# Patient Record
Sex: Female | Born: 1935 | Race: White | Hispanic: No | Marital: Married | Smoking: Never smoker
Health system: Southern US, Community
[De-identification: ages and names within clinical notes are randomized; demographics above are authoritative.]

## PROBLEM LIST (undated history)

## (undated) DIAGNOSIS — I10 Essential (primary) hypertension: Secondary | ICD-10-CM

## (undated) HISTORY — DX: Essential (primary) hypertension: I10

## (undated) HISTORY — PX: EYE SURGERY: SHX253

---

## 1989-11-11 HISTORY — PX: VAGINAL HYSTERECTOMY: SUR661

## 1998-08-18 ENCOUNTER — Other Ambulatory Visit: Admission: RE | Admit: 1998-08-18 | Discharge: 1998-08-18 | Payer: Self-pay | Admitting: Obstetrics and Gynecology

## 1999-09-04 ENCOUNTER — Other Ambulatory Visit: Admission: RE | Admit: 1999-09-04 | Discharge: 1999-09-04 | Payer: Self-pay | Admitting: Obstetrics and Gynecology

## 2001-10-19 ENCOUNTER — Other Ambulatory Visit: Admission: RE | Admit: 2001-10-19 | Discharge: 2001-10-19 | Payer: Self-pay | Admitting: Obstetrics and Gynecology

## 2003-12-05 ENCOUNTER — Other Ambulatory Visit: Admission: RE | Admit: 2003-12-05 | Discharge: 2003-12-05 | Payer: Self-pay | Admitting: Obstetrics and Gynecology

## 2009-05-26 ENCOUNTER — Emergency Department (HOSPITAL_COMMUNITY): Admission: EM | Admit: 2009-05-26 | Discharge: 2009-05-27 | Payer: Self-pay | Admitting: Emergency Medicine

## 2011-02-17 LAB — URINALYSIS, ROUTINE W REFLEX MICROSCOPIC
Bilirubin Urine: NEGATIVE
Ketones, ur: NEGATIVE mg/dL
Nitrite: NEGATIVE
Specific Gravity, Urine: 1.007 (ref 1.005–1.030)
Urobilinogen, UA: 0.2 mg/dL (ref 0.0–1.0)
pH: 7.5 (ref 5.0–8.0)

## 2011-02-17 LAB — DIFFERENTIAL
Basophils Absolute: 0.1 10*3/uL (ref 0.0–0.1)
Basophils Relative: 0 % (ref 0–1)
Eosinophils Relative: 0 % (ref 0–5)
Lymphocytes Relative: 8 % — ABNORMAL LOW (ref 12–46)
Monocytes Absolute: 0.6 10*3/uL (ref 0.1–1.0)
Neutro Abs: 11.8 10*3/uL — ABNORMAL HIGH (ref 1.7–7.7)

## 2011-02-17 LAB — HEPATIC FUNCTION PANEL
Bilirubin, Direct: 0.2 mg/dL (ref 0.0–0.3)
Indirect Bilirubin: 0.9 mg/dL (ref 0.3–0.9)
Total Protein: 6.9 g/dL (ref 6.0–8.3)

## 2011-02-17 LAB — POCT I-STAT, CHEM 8
Calcium, Ion: 1.03 mmol/L — ABNORMAL LOW (ref 1.12–1.32)
Chloride: 93 mEq/L — ABNORMAL LOW (ref 96–112)
Glucose, Bld: 123 mg/dL — ABNORMAL HIGH (ref 70–99)
HCT: 39 % (ref 36.0–46.0)
Hemoglobin: 13.3 g/dL (ref 12.0–15.0)

## 2011-02-17 LAB — LIPASE, BLOOD: Lipase: 23 U/L (ref 11–59)

## 2011-02-17 LAB — CBC
MCHC: 33.2 g/dL (ref 30.0–36.0)
Platelets: 239 10*3/uL (ref 150–400)
RDW: 12.8 % (ref 11.5–15.5)

## 2012-02-06 ENCOUNTER — Encounter: Payer: Self-pay | Admitting: Gastroenterology

## 2012-02-26 ENCOUNTER — Ambulatory Visit (AMBULATORY_SURGERY_CENTER): Payer: Medicare Other | Admitting: *Deleted

## 2012-02-26 VITALS — Ht 62.0 in | Wt 133.4 lb

## 2012-02-26 DIAGNOSIS — Z1211 Encounter for screening for malignant neoplasm of colon: Secondary | ICD-10-CM

## 2012-02-26 MED ORDER — MOVIPREP 100 G PO SOLR: ORAL | Status: DC

## 2012-03-18 ENCOUNTER — Ambulatory Visit (AMBULATORY_SURGERY_CENTER): Payer: Medicare Other | Admitting: Gastroenterology

## 2012-03-18 ENCOUNTER — Encounter: Payer: Self-pay | Admitting: Gastroenterology

## 2012-03-18 VITALS — BP 140/91 | HR 74 | Temp 97.2°F | Resp 17 | Ht 62.0 in | Wt 133.0 lb

## 2012-03-18 DIAGNOSIS — Z1211 Encounter for screening for malignant neoplasm of colon: Secondary | ICD-10-CM

## 2012-03-18 DIAGNOSIS — K5730 Diverticulosis of large intestine without perforation or abscess without bleeding: Secondary | ICD-10-CM

## 2012-03-18 MED ORDER — SODIUM CHLORIDE 0.9 % IV SOLN: 500.0000 mL | INTRAVENOUS | Status: DC

## 2012-03-18 NOTE — Progress Notes (Signed)
Patient did not have preoperative order for IV antibiotic SSI prophylaxis. (G8918)  Patient did not experience any of the following events: a burn prior to discharge; a fall within the facility; wrong site/side/patient/procedure/implant event; or a hospital transfer or hospital admission upon discharge from the facility. (G8907)  

## 2012-03-18 NOTE — Op Note (Signed)
[facility] [address] [place], [place]  [address]  COLONOSCOPY PROCEDURE REPORT  PATIENT:  Kimberly, Byrd  MR#:  [id number] BIRTHDATE:  12-19-1930, 80 yrs. old  GENDER:  female ENDOSCOPIST:  Vania Rea. Jarold Motto, MD, [facility] REF. BY: PROCEDURE DATE:  03/18/2012 PROCEDURE:  Average-risk screening colonoscopy G0121 ASA CLASS:  Class III INDICATIONS:  Routine Risk Screening MEDICATIONS:   propofol (Diprivan) 150 mg IV  DESCRIPTION OF PROCEDURE:   After the risks and benefits and of the procedure were explained, informed consent was obtained. Digital rectal exam was performed and revealed no abnormalities. The LB CF-[id number] endoscope was introduced through the anus and advanced to the cecum, which was identified by both the appendix and ileocecal valve.  The quality of the prep was excellent, using MoviPrep.  The instrument was then slowly withdrawn as the colon was fully examined. <<PROCEDUREIMAGES>>  FINDINGS:  There were mild diverticular changes in left colon. diverticulosis was found.  No polyps or cancers were seen.  This was otherwise a normal examination of the colon.   Retroflexed views in the rectum revealed no abnormalities.    The scope was then withdrawn from the patient and the procedure completed.  COMPLICATIONS:  None ENDOSCOPIC IMPRESSION: 1) Diverticulosis,mild,left sided diverticulosis 2) No polyps or cancers 3) Otherwise normal examination RECOMMENDATIONS: 1) Continue current medications 2) High fiber diet.  REPEAT EXAM:  No  ______________________________ Vania Rea. Jarold Motto, MD, [facility]  CC:  Tally Joe, MD  n. Rosalie Doctor:   Vania Rea. Chavie Kolinski at 03/18/2012 11:23 AM  Lynett Grimes, [phone]

## 2012-03-18 NOTE — Patient Instructions (Signed)
YOU HAD AN ENDOSCOPIC PROCEDURE TODAY AT THE [facility]: Refer to the procedure report that was given to you for any specific questions about what was found during the examination.  If the procedure report does not answer your questions, please call your gastroenterologist to clarify.  If you requested that your care partner not be given the details of your procedure findings, then the procedure report has been included in a sealed envelope for you to review at your convenience later.  YOU SHOULD EXPECT: Some feelings of bloating in the abdomen. Passage of more gas than usual.  Walking can help get rid of the air that was put into your GI tract during the procedure and reduce the bloating. If you had a lower endoscopy (such as a colonoscopy or flexible sigmoidoscopy) you may notice spotting of blood in your stool or on the toilet paper. If you underwent a bowel prep for your procedure, then you may not have a normal bowel movement for a few days.  DIET: Your first meal following the procedure should be a light meal and then it is ok to progress to your normal diet.  A half-sandwich or bowl of soup is an example of a good first meal.  Heavy or fried foods are harder to digest and may make you feel nauseous or bloated.  Likewise meals heavy in dairy and vegetables can cause extra gas to form and this can also increase the bloating.  Drink plenty of fluids but you should avoid alcoholic beverages for 24 hours.  ACTIVITY: Your care partner should take you home directly after the procedure.  You should plan to take it easy, moving slowly for the rest of the day.  You can resume normal activity the day after the procedure however you should NOT DRIVE or use heavy machinery for 24 hours (because of the sedation medicines used during the test).    SYMPTOMS TO REPORT IMMEDIATELY: A gastroenterologist can be reached at any hour.  During normal business hours, 8:30 AM to 5:00 PM Monday through Friday,  call [phone].  After hours and on weekends, please call the GI answering service at [phone] who will take a message and have the physician on call contact you.   Following lower endoscopy (colonoscopy or flexible sigmoidoscopy):  Excessive amounts of blood in the stool  Significant tenderness or worsening of abdominal pains  Swelling of the abdomen that is new, acute  Fever of 100F or higher  Following upper endoscopy (EGD)  Vomiting of blood or coffee ground material  New chest pain or pain under the shoulder blades  Painful or persistently difficult swallowing  New shortness of breath  Fever of 100F or higher  Black, tarry-looking stools  FOLLOW UP: If any biopsies were taken you will be contacted by phone or by letter within the next 1-3 weeks.  Call your gastroenterologist if you have not heard about the biopsies in 3 weeks.  Our staff will call the home number listed on your records the next business day following your procedure to check on you and address any questions or concerns that you may have at that time regarding the information given to you following your procedure. This is a courtesy call and so if there is no answer at the home number and we have not heard from you through the emergency physician on call, we will assume that you have returned to your regular daily activities without incident.  SIGNATURES/CONFIDENTIALITY: You and/or your care   partner have signed paperwork which will be entered into your electronic medical record.  These signatures attest to the fact that that the information above on your After Visit Summary has been reviewed and is understood.  Full responsibility of the confidentiality of this discharge information lies with you and/or your care-partner.  

## 2012-03-19 ENCOUNTER — Telehealth: Payer: Self-pay | Admitting: *Deleted

## 2012-03-19 NOTE — Telephone Encounter (Signed)
  Follow up Call-  Call back number 03/18/2012  Post procedure Call Back phone  # [phone]  Permission to leave phone message Yes     Patient questions:  Do you have a fever, pain , or abdominal swelling? no Pain Score  0 *  Have you tolerated food without any problems? yes  Have you been able to return to your normal activities? yes  Do you have any questions about your discharge instructions: Diet   no Medications  no Follow up visit  no  Do you have questions or concerns about your Care? no  Actions: * If pain score is 4 or above: No action needed, pain <4.

## 2012-09-08 ENCOUNTER — Telehealth: Payer: Self-pay | Admitting: Gastroenterology

## 2012-09-09 NOTE — Telephone Encounter (Signed)
Pt's husband wanted the HI Fiber info sheets and the brochure listing hi fiber foods; mailed to pt.

## 2013-02-26 ENCOUNTER — Encounter (HOSPITAL_COMMUNITY): Payer: Self-pay | Admitting: Emergency Medicine

## 2013-02-26 ENCOUNTER — Emergency Department (HOSPITAL_COMMUNITY)
Admission: EM | Admit: 2013-02-26 | Discharge: 2013-02-27 | Disposition: A | Discharge status: Home / Self Care | Payer: Medicare Other | Attending: Emergency Medicine | Admitting: Emergency Medicine

## 2013-02-26 DIAGNOSIS — I10 Essential (primary) hypertension: Secondary | ICD-10-CM | POA: Insufficient documentation

## 2013-02-26 DIAGNOSIS — Z8669 Personal history of other diseases of the nervous system and sense organs: Secondary | ICD-10-CM | POA: Insufficient documentation

## 2013-02-26 DIAGNOSIS — Z79899 Other long term (current) drug therapy: Secondary | ICD-10-CM | POA: Insufficient documentation

## 2013-02-26 DIAGNOSIS — I160 Hypertensive urgency: Secondary | ICD-10-CM

## 2013-02-26 NOTE — ED Notes (Signed)
Pt st's she was seen by her MD last week and was hypertensive while there.  Her MD told her to return in a week to have it rechecked but office was closed today.  Pt st's she took her B/P at home and it was high. Pt denies any pain st's she just feels shaky.

## 2013-02-27 ENCOUNTER — Other Ambulatory Visit: Payer: Self-pay

## 2013-02-27 LAB — BASIC METABOLIC PANEL
Chloride: 101 mEq/L (ref 96–112)
GFR calc Af Amer: >90 mL/min (ref >90)
GFR calc non Af Amer: 78 mL/min — ABNORMAL LOW (ref >90)
Potassium: 4 mEq/L (ref 3.5–5.1)
Sodium: 138 mEq/L (ref 135–145)

## 2013-02-27 LAB — TROPONIN I: Troponin I: <0.3 ng/mL (ref <0.30)

## 2013-02-27 MED ORDER — HYDRALAZINE HCL 20 MG/ML IJ SOLN
10.0000 mg | Freq: Once | INTRAMUSCULAR | Status: AC
  Administered 2013-02-27: 10 mg via INTRAVENOUS
  Filled 2013-02-27: qty 1

## 2013-02-27 MED ORDER — ONDANSETRON HCL 4 MG/2ML IJ SOLN
4.0000 mg | Freq: Once | INTRAMUSCULAR | Status: AC
  Administered 2013-02-27: 4 mg via INTRAVENOUS
  Filled 2013-02-27: qty 2

## 2013-02-27 NOTE — ED Notes (Signed)
Ambulated pt in hall. Pt blood pressure 123/41. Pt states she feels better, denies dizziness.

## 2013-02-27 NOTE — ED Notes (Signed)
Pt reported to RN that she just felt a sharp pain in mid sternal region and tremors in bilateral legs and arms. EKG ordered and EDP notified.

## 2013-02-27 NOTE — ED Notes (Signed)
Pt states that she took her blood pressure last evening and the blood pressure cuff kept tightening up and reading a very high blood pressure. Came to the ER for further evaluation. Denies dizziness, light headedness, chest pain, nausea or vomiting with the high blood pressure.

## 2013-02-27 NOTE — ED Provider Notes (Addendum)
History     CSN: [id number]  Arrival date & time 02/26/13  2159   First MD Initiated Contact with Patient 02/26/13 2351      Chief Complaint  Patient presents with  . Hypertension    (Consider location/radiation/quality/duration/timing/severity/associated sxs/prior treatment) HPI Comments: Pt comes in with cc of HTN. Pt has hx of HTN, she is on amlodipine and atenolol, recently taken off of HCTZ. Pt states that this evening she was checking her pressures, and they kept rising - upto 200 SBP. She got nervous and came to the ER. With the BP elevation, patient has no chest pain, dib, nausea, visual complains, weakness, gait instability. She has no other medical problems. No stimulant use.  Patient is a 79 y.o. female presenting with hypertension. The history is provided by the patient.  Hypertension Pertinent negatives include no chest pain, no abdominal pain, no headaches and no shortness of breath.    Past Medical History  Diagnosis Date  . Hypertension   . Cataract     Past Surgical History  Procedure Laterality Date  . Vaginal hysterectomy  1991    Family History  Problem Relation Age of Onset  . Breast cancer Mother   . Heart disease Brother     History  Substance Use Topics  . Smoking status: Never Smoker   . Smokeless tobacco: Never Used  . Alcohol Use: No    OB History   Grav Para Term Preterm Abortions TAB SAB Ect Mult Living                  Review of Systems  Constitutional: Negative for activity change.  HENT: Negative for neck pain.   Respiratory: Negative for shortness of breath.   Cardiovascular: Negative for chest pain.  Gastrointestinal: Negative for nausea, vomiting and abdominal pain.  Genitourinary: Negative for dysuria.  Neurological: Negative for headaches.    Allergies  Levofloxacin; Penicillins; Sulfa antibiotics; and Lipitor  Home Medications   Current Outpatient Rx  Name  Route  Sig  Dispense  Refill  . alendronate (FOSAMAX)  70 MG tablet   Oral   Take 70 mg by mouth every 7 (seven) days. Take with a full glass of water on an empty stomach.         Marland Kitchen amLODipine (NORVASC) 2.5 MG tablet   Oral   Take 2.5 mg by mouth daily.         Marland Kitchen atenolol (TENORMIN) 50 MG tablet   Oral   Take 50 mg by mouth daily.         . calcium carbonate (OS-CAL) 600 MG TABS   Oral   Take 600 mg by mouth daily.         . Cholecalciferol 2000 UNITS CAPS   Oral   Take 2,000 Units by mouth daily.         . diclofenac sodium (VOLTAREN) 1 % GEL   Topical   Apply 2 g topically 3 (three) times daily as needed (for pain).         Marland Kitchen ezetimibe (ZETIA) 10 MG tablet   Oral   Take 10 mg by mouth daily.           BP 184/71  Pulse 62  Temp(Src) 98.6 F (37 C) (Oral)  Resp 16  SpO2 99%  Physical Exam  Nursing note and vitals reviewed. Constitutional: She is oriented to person, place, and time. She appears well-developed and well-nourished.  HENT:  Head: Normocephalic and atraumatic.  Eyes: EOM are normal. Pupils are equal, round, and reactive to light.  Neck: Neck supple.  Cardiovascular: Normal rate, regular rhythm and normal heart sounds.   No murmur heard. Pulmonary/Chest: Effort normal. No respiratory distress.  Abdominal: Soft. She exhibits no distension. There is no tenderness. There is no rebound and no guarding.  Neurological: She is alert and oriented to person, place, and time.  Skin: Skin is warm and dry.    ED Course  Procedures (including critical care time)  Labs Reviewed  BASIC METABOLIC PANEL  TROPONIN I   No results found.   No diagnosis found.    MDM   Date: 02/27/2013  Rate: 55  Rhythm: normal sinus rhythm  QRS Axis: normal  Intervals: normal  ST/T Wave abnormalities: normal  Conduction Disutrbances: none  Narrative Interpretation: t wave look peaked.  Pt comes in with cc of eelvated BP. Asymptomatic. Will get basic labs. Will give some hydralazine while in the  ED. Advised to see pcp immediately for med optimization.   Derwood Kaplan, MD 02/27/13 0130  3:30 AM Pt's BP was in the 220s at arrival, it was in the 190s  SBP when i saw her. Pt was given 10 mg iv hydralazine, and within 15-20 minutes - patient started having some bradycardia.   Date: 02/27/2013  Rate: 39  Rhythm: sinus bradycardia  QRS Axis: normal  Intervals: normal  ST/T Wave abnormalities: normal  Conduction Disutrbances: none  Narrative Interpretation: unremarkable  Pt reported at time that she felt 2 seconds of substernal discomfort.  Pt's BP dropped to the 80s SBP and then 60s SBP.  Pt was mentating, well, no dib, no diophoresis, no ams, no chest pain - so we initiated fluid bolus, applied pads to the chest and got the crash cart to the room. Atropine was placed on stand by.  Repeat EKG - with posterior leads:  Date: 02/27/2013  Rate: 55  Rhythm: sinus bradycardia  QRS Axis: normal  Intervals: normal  ST/T Wave abnormalities: non specific t wave changes  Conduction Disutrbances: none  Narrative Interpretation: unremarkable  And then after about 200 cc of fluids in Pt's BP improved to mid 90s SBP.   Date: 02/27/2013  Rate: 60  Rhythm: normal sinus rhythm  QRS Axis: normal  Intervals: normal  ST/T Wave abnormalities: non specific t wave changes  Conduction Disutrbances: none  Narrative Interpretation: unremarkable   I dont suspect an inferior MI. I do think patients reaction was due to hydralazine and we have started hydration. We will continue to keep the pacer pads on and monitor hemodynamics very closely.  Hydralazine iv is not long lasting, and so she will be observed for 2 more hours - if not significantly better, will be admitted.              Derwood Kaplan, MD 02/27/13 [phone]

## 2013-02-27 NOTE — ED Notes (Signed)
Pt states feeling nauseated. Skin pale, HR low, BP low. Nanavati informed. NS Bolus ordered and administered.

## 2013-03-02 MED FILL — Medication: Qty: 1 | Status: AC

## 2013-04-12 ENCOUNTER — Ambulatory Visit
Admission: RE | Admit: 2013-04-12 | Discharge: 2013-04-12 | Disposition: A | Discharge status: Home / Self Care | Payer: 59 | Source: Ambulatory Visit | Attending: Family Medicine | Admitting: Family Medicine

## 2013-04-12 ENCOUNTER — Other Ambulatory Visit: Payer: Self-pay | Admitting: Family Medicine

## 2013-04-12 DIAGNOSIS — M25571 Pain in right ankle and joints of right foot: Secondary | ICD-10-CM

## 2014-07-21 ENCOUNTER — Encounter: Payer: Self-pay | Admitting: Gastroenterology

## 2014-08-04 ENCOUNTER — Other Ambulatory Visit: Payer: Self-pay | Admitting: Family Medicine

## 2014-08-04 ENCOUNTER — Ambulatory Visit
Admission: RE | Admit: 2014-08-04 | Discharge: 2014-08-04 | Disposition: A | Discharge status: Home / Self Care | Payer: 59 | Source: Ambulatory Visit | Attending: Family Medicine | Admitting: Family Medicine

## 2014-08-04 DIAGNOSIS — S3992XA Unspecified injury of lower back, initial encounter: Secondary | ICD-10-CM

## 2015-09-05 ENCOUNTER — Emergency Department (HOSPITAL_COMMUNITY): Payer: Medicare Other

## 2015-09-05 ENCOUNTER — Encounter (HOSPITAL_COMMUNITY): Payer: Self-pay

## 2015-09-05 ENCOUNTER — Emergency Department (HOSPITAL_COMMUNITY)
Admission: EM | Admit: 2015-09-05 | Discharge: 2015-09-05 | Disposition: A | Discharge status: Home / Self Care | Payer: Medicare Other | Attending: Emergency Medicine | Admitting: Emergency Medicine

## 2015-09-05 DIAGNOSIS — I10 Essential (primary) hypertension: Secondary | ICD-10-CM | POA: Insufficient documentation

## 2015-09-05 DIAGNOSIS — Z79899 Other long term (current) drug therapy: Secondary | ICD-10-CM | POA: Insufficient documentation

## 2015-09-05 DIAGNOSIS — R112 Nausea with vomiting, unspecified: Secondary | ICD-10-CM | POA: Diagnosis not present

## 2015-09-05 DIAGNOSIS — R51 Headache: Secondary | ICD-10-CM | POA: Diagnosis not present

## 2015-09-05 DIAGNOSIS — M5442 Lumbago with sciatica, left side: Secondary | ICD-10-CM | POA: Diagnosis not present

## 2015-09-05 LAB — COMPREHENSIVE METABOLIC PANEL
ALT: 18 U/L (ref 14–54)
AST: 27 U/L (ref 15–41)
Albumin: 4.2 g/dL (ref 3.5–5.0)
Alkaline Phosphatase: 66 U/L (ref 38–126)
Anion gap: 11 (ref 5–15)
BUN: 11 mg/dL (ref 6–20)
CHLORIDE: 98 mmol/L — AB (ref 101–111)
CO2: 27 mmol/L (ref 22–32)
CREATININE: 0.76 mg/dL (ref 0.44–1.00)
Calcium: 9.7 mg/dL (ref 8.9–10.3)
GFR calc non Af Amer: >60 mL/min (ref >60)
Glucose, Bld: 158 mg/dL — ABNORMAL HIGH (ref 65–99)
POTASSIUM: 3.5 mmol/L (ref 3.5–5.1)
SODIUM: 136 mmol/L (ref 135–145)
Total Bilirubin: 1.2 mg/dL (ref 0.3–1.2)
Total Protein: 7.2 g/dL (ref 6.5–8.1)

## 2015-09-05 LAB — URINALYSIS, ROUTINE W REFLEX MICROSCOPIC
Bilirubin Urine: NEGATIVE
Glucose, UA: NEGATIVE mg/dL
HGB URINE DIPSTICK: NEGATIVE
Ketones, ur: 40 mg/dL — AB
Leukocytes, UA: NEGATIVE
Nitrite: NEGATIVE
Protein, ur: NEGATIVE mg/dL
SPECIFIC GRAVITY, URINE: 1.011 (ref 1.005–1.030)
UROBILINOGEN UA: 0.2 mg/dL (ref 0.0–1.0)
pH: 8 (ref 5.0–8.0)

## 2015-09-05 LAB — I-STAT TROPONIN, ED: TROPONIN I, POC: 0 ng/mL (ref 0.00–0.08)

## 2015-09-05 LAB — CBC
HEMATOCRIT: 40.5 % (ref 36.0–46.0)
Hemoglobin: 13.9 g/dL (ref 12.0–15.0)
MCH: 30 pg (ref 26.0–34.0)
MCHC: 34.3 g/dL (ref 30.0–36.0)
MCV: 87.3 fL (ref 78.0–100.0)
Platelets: 236 10*3/uL (ref 150–400)
RBC: 4.64 MIL/uL (ref 3.87–5.11)
RDW: 12.6 % (ref 11.5–15.5)
WBC: 21 10*3/uL — ABNORMAL HIGH (ref 4.0–10.5)

## 2015-09-05 LAB — I-STAT CG4 LACTIC ACID, ED: Lactic Acid, Venous: 1.36 mmol/L (ref 0.5–2.0)

## 2015-09-05 LAB — LIPASE, BLOOD: LIPASE: 28 U/L (ref 11–51)

## 2015-09-05 MED ORDER — HYDROCODONE-ACETAMINOPHEN 5-325 MG PO TABS
1.0000 | ORAL_TABLET | Freq: Once | ORAL | Status: AC
  Administered 2015-09-05: 1 via ORAL
  Filled 2015-09-05: qty 1

## 2015-09-05 MED ORDER — SODIUM CHLORIDE 0.9 % IV BOLUS (SEPSIS)
500.0000 mL | Freq: Once | INTRAVENOUS | Status: AC
  Administered 2015-09-05: 500 mL via INTRAVENOUS

## 2015-09-05 MED ORDER — PANTOPRAZOLE SODIUM 40 MG IV SOLR
40.0000 mg | Freq: Once | INTRAVENOUS | Status: AC
  Administered 2015-09-05: 40 mg via INTRAVENOUS
  Filled 2015-09-05: qty 40

## 2015-09-05 MED ORDER — PROMETHAZINE HCL 25 MG/ML IJ SOLN
12.5000 mg | Freq: Once | INTRAMUSCULAR | Status: AC
  Administered 2015-09-05: 12.5 mg via INTRAVENOUS
  Filled 2015-09-05: qty 1

## 2015-09-05 MED ORDER — METOCLOPRAMIDE HCL 5 MG/ML IJ SOLN
10.0000 mg | Freq: Once | INTRAMUSCULAR | Status: AC
  Administered 2015-09-05: 10 mg via INTRAVENOUS
  Filled 2015-09-05: qty 2

## 2015-09-05 MED ORDER — TRAMADOL HCL 50 MG PO TABS: 50.0000 mg | ORAL_TABLET | Freq: Four times a day (QID) | ORAL | Status: DC | PRN

## 2015-09-05 MED ORDER — SODIUM CHLORIDE 0.9 % IV BOLUS (SEPSIS)
1000.0000 mL | Freq: Once | INTRAVENOUS | Status: AC
  Administered 2015-09-05: 1000 mL via INTRAVENOUS

## 2015-09-05 MED ORDER — ONDANSETRON 8 MG PO TBDP: 8.0000 mg | ORAL_TABLET | Freq: Three times a day (TID) | ORAL | Status: DC | PRN

## 2015-09-05 MED ORDER — ONDANSETRON HCL 4 MG/2ML IJ SOLN
4.0000 mg | Freq: Once | INTRAMUSCULAR | Status: AC | PRN
  Administered 2015-09-05: 4 mg via INTRAVENOUS
  Filled 2015-09-05: qty 2

## 2015-09-05 NOTE — ED Provider Notes (Signed)
Signed out by Dr Anitra LauthPlunkett to d/c to home after pain med, po fluids, if nv improved.  Pt notes nausea much improved.  Pt is tolerating po fluids, without recurrent nv. No abd pain. Patient denies fever or chills. No chest pain or sob.  Hx low back pain, recent steroid tx for same, denies acute or abrupt worsening of pain, but persistent. No new numbness or weakness.  abd soft nt. Ambulates w steady gait. No new c/o. Afeb.  Pt requests pain rx for home.   Pt currently appears stable for d/c.      Cathren LaineKevin Reilley Latorre, MD 09/05/15 1715

## 2015-09-05 NOTE — ED Notes (Signed)
RN went to discharge pt who reports she is still very nauseated.  Steinl MD to be made aware.

## 2015-09-05 NOTE — Discharge Instructions (Signed)
It was our pleasure to provide your ER care today - we hope that you feel better.  Rest. Drink plenty of fluids.  You may take ultram as need for pain - no driving when taking.  You may take zofran as need for nausea.  Follow up with primary care doctor in the next 1-2 days for recheck if symptoms fail to improve/resolve.  Return to ER right away if worse, new symptoms, persistent vomiting, severe abdominal pain, intractable back pain, numbness/weakness in leg, fevers, other concern.     Nausea and Vomiting Nausea is a sick feeling that often comes before throwing up (vomiting). Vomiting is a reflex where stomach contents come out of your mouth. Vomiting can cause severe loss of body fluids (dehydration). Children and elderly adults can become dehydrated quickly, especially if they also have diarrhea. Nausea and vomiting are symptoms of a condition or disease. It is important to find the cause of your symptoms. CAUSES   Direct irritation of the stomach lining. This irritation can result from increased acid production (gastroesophageal reflux disease), infection, food poisoning, taking certain medicines (such as nonsteroidal anti-inflammatory drugs), alcohol use, or tobacco use.  Signals from the brain.These signals could be caused by a headache, heat exposure, an inner ear disturbance, increased pressure in the brain from injury, infection, a tumor, or a concussion, pain, emotional stimulus, or metabolic problems.  An obstruction in the gastrointestinal tract (bowel obstruction).  Illnesses such as diabetes, hepatitis, gallbladder problems, appendicitis, kidney problems, cancer, sepsis, atypical symptoms of a heart attack, or eating disorders.  Medical treatments such as chemotherapy and radiation.  Receiving medicine that makes you sleep (general anesthetic) during surgery. DIAGNOSIS Your caregiver may ask for tests to be done if the problems do not improve after a few days. Tests  may also be done if symptoms are severe or if the reason for the nausea and vomiting is not clear. Tests may include:  Urine tests.  Blood tests.  Stool tests.  Cultures (to look for evidence of infection).  X-rays or other imaging studies. Test results can help your caregiver make decisions about treatment or the need for additional tests. TREATMENT You need to stay well hydrated. Drink frequently but in small amounts.You may wish to drink water, sports drinks, clear broth, or eat frozen ice pops or gelatin dessert to help stay hydrated.When you eat, eating slowly may help prevent nausea.There are also some antinausea medicines that may help prevent nausea. HOME CARE INSTRUCTIONS   Take all medicine as directed by your caregiver.  If you do not have an appetite, do not force yourself to eat. However, you must continue to drink fluids.  If you have an appetite, eat a normal diet unless your caregiver tells you differently.  Eat a variety of complex carbohydrates (rice, wheat, potatoes, bread), lean meats, yogurt, fruits, and vegetables.  Avoid high-fat foods because they are more difficult to digest.  Drink enough water and fluids to keep your urine clear or pale yellow.  If you are dehydrated, ask your caregiver for specific rehydration instructions. Signs of dehydration may include:  Severe thirst.  Dry lips and mouth.  Dizziness.  Dark urine.  Decreasing urine frequency and amount.  Confusion.  Rapid breathing or pulse. SEEK IMMEDIATE MEDICAL CARE IF:   You have blood or brown flecks (like coffee grounds) in your vomit.  You have black or bloody stools.  You have a severe headache or stiff neck.  You are confused.  You have severe  abdominal pain.  You have chest pain or trouble breathing.  You do not urinate at least once every 8 hours.  You develop cold or clammy skin.  You continue to vomit for longer than 24 to 48 hours.  You have a fever. MAKE  SURE YOU:   Understand these instructions.  Will watch your condition.  Will get help right away if you are not doing well or get worse.   This information is not intended to replace advice given to you by your health care provider. Make sure you discuss any questions you have with your health care provider.   Document Released: 10/28/2005 Document Revised: 01/20/2012 Document Reviewed: 03/27/2011 Elsevier Interactive Patient Education 2016 [employer]    Back Pain, Adult Back pain is very common in adults.The cause of back pain is rarely dangerous and the pain often gets better over time.The cause of your back pain may not be known. Some common causes of back pain include:  Strain of the muscles or ligaments supporting the spine.  Wear and tear (degeneration) of the spinal disks.  Arthritis.  Direct injury to the back. For many people, back pain may return. Since back pain is rarely dangerous, most people can learn to manage this condition on their own. HOME CARE INSTRUCTIONS Watch your back pain for any changes. The following actions may help to lessen any discomfort you are feeling:  Remain active. It is stressful on your back to sit or stand in one place for long periods of time. Do not sit, drive, or stand in one place for more than 30 minutes at a time. Take short walks on even surfaces as soon as you are able.Try to increase the length of time you walk each day.  Exercise regularly as directed by your health care provider. Exercise helps your back heal faster. It also helps avoid future injury by keeping your muscles strong and flexible.  Do not stay in bed.Resting more than 1-2 days can delay your recovery.  Pay attention to your body when you bend and lift. The most comfortable positions are those that put less stress on your recovering back. Always use proper lifting techniques, including:  Bending your knees.  Keeping the load close to your body.  Avoiding  twisting.  Find a comfortable position to sleep. Use a firm mattress and lie on your side with your knees slightly bent. If you lie on your back, put a pillow under your knees.  Avoid feeling anxious or stressed.Stress increases muscle tension and can worsen back pain.It is important to recognize when you are anxious or stressed and learn ways to manage it, such as with exercise.  Take medicines only as directed by your health care provider. Over-the-counter medicines to reduce pain and inflammation are often the most helpful.Your health care provider may prescribe muscle relaxant drugs.These medicines help dull your pain so you can more quickly return to your normal activities and healthy exercise.  Apply ice to the injured area:  Put ice in a plastic bag.  Place a towel between your skin and the bag.  Leave the ice on for 20 minutes, 2-3 times a day for the first 2-3 days. After that, ice and heat may be alternated to reduce pain and spasms.  Maintain a healthy weight. Excess weight puts extra stress on your back and makes it difficult to maintain good posture. SEEK MEDICAL CARE IF:  You have pain that is not relieved with rest or medicine.  You have  increasing pain going down into the legs or buttocks.  You have pain that does not improve in one week.  You have night pain.  You lose weight.  You have a fever or chills. SEEK IMMEDIATE MEDICAL CARE IF:   You develop new bowel or bladder control problems.  You have unusual weakness or numbness in your arms or legs.  You develop nausea or vomiting.  You develop abdominal pain.  You feel faint.   This information is not intended to replace advice given to you by your health care provider. Make sure you discuss any questions you have with your health care provider.   Document Released: 10/28/2005 Document Revised: 11/18/2014 Document Reviewed: 03/01/2014 Elsevier Interactive Patient Education [employer]

## 2015-09-05 NOTE — ED Notes (Signed)
Pt reports n/v that began this morning at 0900.  Pt reports 6 episodes of vomiting.  Pt denies abdominal pain and diarrhea but reports constant nausea.

## 2015-09-05 NOTE — ED Provider Notes (Signed)
CSN: [id number]     Arrival date & time 09/05/15  1331 History   First MD Initiated Contact with Patient 09/05/15 1341     Chief Complaint  Patient presents with  . Emesis     (Consider location/radiation/quality/duration/timing/severity/associated sxs/prior Treatment) Patient is a 81 y.o. female presenting with vomiting. The history is provided by the patient and the spouse.  Emesis Severity:  Severe Duration:  3 days Timing:  Intermittent Number of daily episodes:  Only vomiting today but nausea over the last few days Quality:  Stomach contents Progression:  Worsening Chronicity:  New Recent urination:  Normal Relieved by:  None tried Worsened by:  Nothing tried Ineffective treatments:  None tried Associated symptoms: headaches   Associated symptoms: no chills, no cough, no diarrhea and no fever   Associated symptoms comment:  Occasional abdominal soreness after vomiting but no localized abdominal pain. Patient has also had a headache since symptoms started. She states she started feeling worse after she got a lumbar steroid injection for sciatica. She denies any other medication changes. No chest pain, shortness of breath, palpitations, leg swelling. Risk factors: no alcohol use, no diabetes, no prior abdominal surgery and no sick contacts     Past Medical History  Diagnosis Date  . Hypertension   . Cataract    Past Surgical History  Procedure Laterality Date  . Vaginal hysterectomy  1991  . Eye surgery     Family History  Problem Relation Age of Onset  . Breast cancer Mother   . Heart disease Brother    Social History  Substance Use Topics  . Smoking status: Never Smoker   . Smokeless tobacco: Never Used  . Alcohol Use: No   OB History    No data available     Review of Systems  Constitutional: Negative for chills.  Gastrointestinal: Positive for vomiting. Negative for diarrhea.  Neurological: Positive for headaches.  All other systems reviewed and are  negative.     Allergies  Levofloxacin; Penicillins; Sulfa antibiotics; and Lipitor  Home Medications   Prior to Admission medications   Medication Sig Start Date End Date Taking? Authorizing Provider  alendronate (FOSAMAX) 70 MG tablet Take 70 mg by mouth every 7 (seven) days. Take with a full glass of water on an empty stomach.    Historical Provider, MD  amLODipine (NORVASC) 2.5 MG tablet Take 2.5 mg by mouth daily.    Historical Provider, MD  atenolol (TENORMIN) 50 MG tablet Take 50 mg by mouth daily.    Historical Provider, MD  calcium carbonate (OS-CAL) 600 MG TABS Take 600 mg by mouth daily.    Historical Provider, MD  Cholecalciferol 2000 UNITS CAPS Take 2,000 Units by mouth daily.    Historical Provider, MD  diclofenac sodium (VOLTAREN) 1 % GEL Apply 2 g topically 3 (three) times daily as needed (for pain).    Historical Provider, MD  ezetimibe (ZETIA) 10 MG tablet Take 10 mg by mouth daily.    Historical Provider, MD   BP 148/58 mmHg  Pulse 60  Temp(Src) 98 F (36.7 C) (Oral)  Resp 16  Ht  (1.575 m)  Wt 120 lb (54.432 kg)  BMI 21.94 kg/m2  SpO2 100% Physical Exam  Constitutional: She is oriented to person, place, and time. She appears well-developed and well-nourished. No distress.  HENT:  Head: Normocephalic and atraumatic.  Mouth/Throat: Oropharynx is clear and moist. Mucous membranes are dry.  Eyes: Conjunctivae and EOM are normal. Pupils are equal,  round, and reactive to light.  Neck: Normal range of motion. Neck supple.  Cardiovascular: Normal rate, regular rhythm and intact distal pulses.   No murmur heard. Pulmonary/Chest: Effort normal and breath sounds normal. No respiratory distress. She has no wheezes. She has no rales.  Abdominal: Soft. She exhibits no distension. Bowel sounds are decreased. There is no tenderness. There is no rebound and no guarding.  Musculoskeletal: Normal range of motion. She exhibits no edema or tenderness.  Neurological: She is  alert and oriented to person, place, and time.  Skin: Skin is warm and dry. No rash noted. No erythema.  Psychiatric: She has a normal mood and affect. Her behavior is normal.  Nursing note and vitals reviewed.   ED Course  Procedures (including critical care time) Labs Review Labs Reviewed  COMPREHENSIVE METABOLIC PANEL - Abnormal; Notable for the following:    Chloride 98 (*)    Glucose, Bld 158 (*)    All other components within normal limits  CBC - Abnormal; Notable for the following:    WBC 21.0 (*)    All other components within normal limits  URINALYSIS, ROUTINE W REFLEX MICROSCOPIC (NOT AT [facility]) - Abnormal; Notable for the following:    APPearance CLOUDY (*)    Ketones, ur 40 (*)    All other components within normal limits  LIPASE, BLOOD  I-STAT TROPOININ, ED  I-STAT CG4 LACTIC ACID, ED    Imaging Review Ct Head Wo Contrast  09/05/2015  CLINICAL DATA:  Headache in the left parietal and occipital region with nausea and vomiting EXAM: CT HEAD WITHOUT CONTRAST TECHNIQUE: Contiguous axial images were obtained from the base of the skull through the vertex without intravenous contrast. COMPARISON:  None. FINDINGS: Skull and Sinuses:No fracture or aggressive process. Focal inflammation in the left maxillary sinus with mucosal thickening and retained secretions. Orbits: Bilateral cataract resection.  No acute finding. Brain: No acute or remote infarction, hemorrhage, hydrocephalus, or mass lesion/mass effect. Normal cerebral volume and white matter for age. IMPRESSION: 1. Negative intracranial finding. 2. Left maxillary sinusitis, potentially acute. Electronically Signed   By: Marnee Spring M.D.   On: 09/05/2015 15:52   Dg Abd Acute W/chest  09/05/2015  CLINICAL DATA:  Nausea and vomiting beginning this morning. Abdominal pain and diarrhea. EXAM: DG ABDOMEN ACUTE W/ 1V CHEST COMPARISON:  None. FINDINGS: Heart size is normal. Atherosclerotic calcifications noted along the walls of  the grossly normal- caliber aortic arch. Lungs appear hyperexpanded suggesting COPD. Mild scarring/ fibrosis noted at each lung apex. Lungs otherwise clear. No evidence of pneumonia. No pleural effusion. Two views of the abdomen demonstrate a grossly nonobstructive bowel gas pattern. No dilated bowel loops or air-fluid levels seen. No free intraperitoneal air. Mild degenerative change noted within the lumbar spine. IMPRESSION: 1. No evidence of acute cardiopulmonary abnormality. Lungs are hyperexpanded suggesting COPD. 2. Nonobstructive bowel gas pattern and no evidence of acute intra-abdominal abnormality. Electronically Signed   By: Bary Richard M.D.   On: 09/05/2015 16:08   I have personally reviewed and evaluated these images and lab results as part of my medical decision-making.   EKG Interpretation   Date/Time:  Tuesday September 05 2015 13:37:39 EDT Ventricular Rate:  61 PR Interval:  182 QRS Duration: 76 QT Interval:  419 QTC Calculation: 422 R Axis:   49 Text Interpretation:  Sinus rhythm Nonspecific T abnormalities, lateral  leads No significant change since last tracing Confirmed by Anitra Lauth  MD,  Alphonzo Lemmings ([id number]) on 09/05/2015 1:41:03 PM  MDM   Final diagnoses:  None    Patient is an 81 year old female with a history of hypertension and sciatica who presents with a 3 to four-day history of worsening nausea leading to vomiting today. Patient denies any fever or localized abdominal pain. She has been having normal bowel movements and denies diarrhea. No hematemesis. Patient states her stomach is intermittently sore from vomiting but denies any localized pain. Patient denies any medication changes however she did get a steroid injection last week in her lumbar spine for sciatica.  Patient denies any fever, urinary problems, chest pain or shortness of breath.  Exam patient appears that she does not feel well but there is no localized abdominal pain, neurologic deficits, neck  pain, abnormalities in the cardiac or lung exam.  She denies any alcohol use with no abdominal pain low suspicion for gallbladder disease versus pancreatitis versus hepatitis. Insert for possible spinal headache as the cause of her vomiting. Patient given IV fluids, no improvement with Zofran so given Phenergan. Labs indicated a leukocytosis of 21,000 which may be due to recent steroid injection but the rest of her labs including a CMP and lipase are without acute findings.  Troponin, lactate pending. Patient's EKG without acute changes.   Concern for possible obstruction will do an acute abdominal series to evaluate  4:22 PM Lactic acid, troponin, UA him a head CT and acute abdominal series are all within normal limits. On reevaluation patient is still nauseated however unclear she received the third dose of antibiotic. On reevaluation she thinks that her vomiting is due to severe back pain which has not improved after getting a steroid injection. She states when the pain is severe she vomits more. She has not been taking any pain medication so patient will begin him one Vicodin here to see if her pain is improved and if so and her vomiting is controlled she will BLD go home with nausea and pain medication.  Gwyneth SproutWhitney Alaja Goldinger, MD 09/05/15 [phone]

## 2015-09-05 NOTE — ED Notes (Signed)
Plunkett MD at bedside. 

## 2016-09-17 ENCOUNTER — Other Ambulatory Visit: Payer: Self-pay | Admitting: Family Medicine

## 2016-09-17 DIAGNOSIS — G3184 Mild cognitive impairment, so stated: Secondary | ICD-10-CM

## 2016-09-30 ENCOUNTER — Ambulatory Visit
Admission: RE | Admit: 2016-09-30 | Discharge: 2016-09-30 | Disposition: A | Discharge status: Home / Self Care | Payer: Medicare Other | Source: Ambulatory Visit | Attending: Family Medicine | Admitting: Family Medicine

## 2016-09-30 DIAGNOSIS — G3184 Mild cognitive impairment, so stated: Secondary | ICD-10-CM

## 2017-04-17 ENCOUNTER — Ambulatory Visit: Payer: Medicare Other | Attending: Internal Medicine | Admitting: Physical Therapy

## 2017-04-17 ENCOUNTER — Encounter: Payer: Self-pay | Admitting: Physical Therapy

## 2017-04-17 DIAGNOSIS — M6281 Muscle weakness (generalized): Secondary | ICD-10-CM | POA: Diagnosis present

## 2017-04-17 DIAGNOSIS — R293 Abnormal posture: Secondary | ICD-10-CM | POA: Insufficient documentation

## 2017-04-17 NOTE — Patient Instructions (Signed)
[employer] handout

## 2017-04-17 NOTE — Therapy (Signed)
[facility] [address] [place], [place], [address] Phone: [phone]   Fax:  [phone]  Physical Therapy Evaluation  Patient Details  Name: Kimberly Byrd MRN: [id number] Date of Birth: [birth year] Referring Provider: Dr. Talmage Coin   Encounter Date: 04/17/2017      PT End of Session - 04/17/17 1629    Visit Number 1   Number of Visits 8   Date for PT Re-Evaluation 05/29/17   PT Start Time 1545   PT Stop Time 1628   PT Time Calculation (min) 43 min   Activity Tolerance Patient tolerated treatment well   Behavior During Therapy [facility] for tasks assessed/performed;Anxious      Past Medical History:  Diagnosis Date  . Cataract   . Hypertension     Past Surgical History:  Procedure Laterality Date  . EYE SURGERY    . VAGINAL HYSTERECTOMY  1991    There were no vitals filed for this visit.       Subjective Assessment - 04/17/17 1555    Subjective Pt referred for Osteoporosis program, diagnosed in 2017.  She received an injection of Prolia at that time by Dr. Azucena Cecil (in arm)for treatment of osteoporosis. She reports "that tore my up", she experinced severe side effects (memory loss, fatigue).  She was then referred to Dr. Sharl Ma.  It is unclear to me if she had cognitive deficits prior to taking theis medicine.  She has min back pain but reports it does not stop her from doing what she needs to do.  She and her spouse continue to exercise at [facility].  They would like to prevent progression of the condition and learn about safe exercises for her to do to benefit her spine health.      Patient is accompained by: Family member   Pertinent History hypertension, osteoporosis   Limitations Lifting;Walking;Standing   Diagnostic tests XR done within the past 2 yrs but not available   Patient Stated Goals keep strong and active    Currently in Pain? Yes   Pain Score 1    Pain Location Back   Pain Orientation Right;Left;Lower    Pain Descriptors / Indicators Tiring   Pain Type Chronic pain   Pain Onset More than a month ago   Pain Frequency Intermittent   Aggravating Factors  walking, standing, lifting (avoids)    Pain Relieving Factors pain does not stop her from doing her normal activities.     Effect of Pain on Daily Activities lives with fatigue and an ache in her low back.     Multiple Pain Sites No            [facility] PT Assessment - 04/17/17 1602      Assessment   Medical Diagnosis osteoporosis    Referring Provider Dr. Talmage Coin    Onset Date/Surgical Date --  chronic    Prior Therapy No      Precautions   Precautions None;Other (comment)   Precaution Comments osteoporosis      Restrictions   Weight Bearing Restrictions No     Balance Screen   Has the patient fallen in the past 6 months Yes   How many times? 1, last week , neighbors' dogs knocked her down    Has the patient had a decrease in activity level because of a fear of falling?  Yes  not due to balance    Is the patient reluctant to leave their home because of a  fear of falling?  No     Home Environment   Living Environment Private residence   Living Arrangements Spouse/significant other     Prior Function   Level of Independence Independent with basic ADLs;Independent with household mobility without device;Independent with homemaking with ambulation   Vocation Retired   Buyer, retailVocation Requirements teacher    Leisure being active with her husband , gardening      Cognition   Overall Cognitive Status History of cognitive impairments - at baseline   Attention Alternating;Divided   Alternating Attention Appears intact   Divided Attention Appears intact   Memory Impaired   Memory Impairment Storage deficit;Decreased recall of new information;Decreased short term memory   Awareness Appears intact   Problem Solving Appears intact   Behaviors Other (comment)  anxious     Observation/Other Assessments   Focus on Therapeutic  Outcomes (FOTO)  46%     Sensation   Light Touch Appears Intact     Squat   Comments good, bends into flexion when in deep squat , does with ease     Sit to Stand   Comments no difficulty      Posture/Postural Control   Postural Limitations Rounded Shoulders;Forward head;Increased thoracic kyphosis     AROM   Right/Left Shoulder --  [facility] FWFL    Lumbar Flexion NT    Lumbar Extension 50% discomfort    Lumbar - Right Side Bend WFL   Lumbar - Left Side Bend WFL   Lumbar - Right Rotation WFL in sitting   Lumbar - Left Rotation WFL in sitting      Strength   Strength Assessment Site --  WFL in bilateral UE and LE      Flexibility   Hamstrings 60 deg no pain      Palpation   Spinal mobility NT   Palpation comment hyper tonic in erector spinae, tense and limited fascial mobility             Objective measurements completed on examination: See above findings.                  PT Education - 04/17/17 2159    Education provided Yes   Education Details HEP, [employer] resources, lifting/posture and body mechanics, PT/POC , flex/rotation precautions    Person(s) Educated Patient   Methods Explanation;Demonstration;Verbal cues;Tactile cues;Handout   Comprehension Verbalized understanding;Need further instruction          PT Short Term Goals - 04/17/17 2214      PT SHORT TERM GOAL #1   Title Pt will begin light walking program as tolerated to improve bone heatth.    Time 3   Period Weeks   Status New           PT Long Term Goals - 04/17/17 2212      PT LONG TERM GOAL #1   Title Pt will be I with HEP for posture and core/hip strength    Time 8   Period Weeks   Status New     PT LONG TERM GOAL #2   Title Pt will demo and fully understand importance of avoiding flexion/rotation with ADLs, lifting to prevent fracture.    Time 8   Period Weeks   Status New     PT LONG TERM GOAL #3   Title Pt will be able to continue gardening, walking  without limitation of back pain    Time 8   Period Weeks   Status New  PT LONG TERM GOAL #4   Title Pt will score <40% limited on FOTO    Time 8   Period Weeks   Status New                Plan - May 06, 2017 1630    Clinical Impression Statement Pt presents for education regarding safe exercise and bone density as it relates to posture, ADLs.  She has good strength and decent flexibility in her hips.  Posture is kyphotic and stiff in thoracic spine with mild lumbar lordosis.  She has no program for her core and could use practice with safe hip hinging for light activities such as gardening and housework.  She was accustomed to going to the gym and riding recumbant bike for 45 min (per spouse), no weights or stretching.  Her back pain was not something she focused on today.  Again, she looks to her spouse to recall complex information and this seems to be fairly new over the past year.     Clinical Presentation Stable   Clinical Decision Making Low   Rehab Potential Excellent   PT Frequency 2x / week   PT Duration 6 weeks  8 vis8 visits total, may do 1 time taper    PT Treatment/Interventions ADLs/Self Care Home Management;Neuromuscular re-education;Manual techniques;Functional mobility training;Moist Heat;Cryotherapy;Therapeutic exercise;Passive range of motion;Electrical Stimulation;Patient/family education   PT Next Visit Plan check hip strength, review questions about handout and practice , walk on TM, standing postural ex, eventually try prone with pillows , decompression?    PT Home Exercise Plan supine LTR, ED from [employer]   Consulted and Agree with Plan of Care Patient;Family member/caregiver   Family Member Consulted spouse      Patient will benefit from skilled therapeutic intervention in order to improve the following deficits and impairments:  Impaired flexibility, Decreased cognition, Increased fascial restricitons, Improper body mechanics, Pain, Decreased  mobility, Postural dysfunction, Decreased strength, Decreased range of motion, Difficulty walking  Visit Diagnosis: Abnormal posture  Muscle weakness (generalized)      G-Codes - 05/06/2017 06-Apr-2217    Functional Assessment Tool Used (Outpatient Only) FOTO   Functional Limitation Carrying, moving and handling objects   Carrying, Moving and Handling Objects Current Status (N5621) At least 40 percent but less than 60 percent impaired, limited or restricted   Carrying, Moving and Handling Objects Goal Status (H0865) At least 20 percent but less than 40 percent impaired, limited or restricted       Problem List There are no active problems to display for this patient.   Isiah Scheel 06-May-2017, 10:19 PM  [facility] [address] [place], [place], [address] Phone: [phone]   Fax:  [phone]  Name: Kimberly Byrd MRN: [id number] Date of Birth: November 28, 1930   Karie Mainland, PT 2017/05/06 10:20 PM Phone: [phone] Fax: [phone]

## 2017-04-29 ENCOUNTER — Ambulatory Visit: Payer: Medicare Other | Admitting: Physical Therapy

## 2017-04-29 ENCOUNTER — Encounter: Payer: Self-pay | Admitting: Physical Therapy

## 2017-04-29 DIAGNOSIS — R293 Abnormal posture: Secondary | ICD-10-CM | POA: Diagnosis not present

## 2017-04-29 DIAGNOSIS — M6281 Muscle weakness (generalized): Secondary | ICD-10-CM

## 2017-04-29 NOTE — Therapy (Signed)
[facility] [address] [place], [place], [address] Phone: [phone]   Fax:  [phone]  Physical Therapy Treatment  Patient Details  Name: Kimberly Byrd MRN: [id number] Date of Birth: 05-30-1931 Referring Provider: Dr. Talmage CoinJeffrey Kerr   Encounter Date: 04/29/2017      PT End of Session - 04/29/17 1028    Visit Number 2   Number of Visits 8   Date for PT Re-Evaluation 05/29/17   PT Start Time 0844   PT Stop Time 0945   PT Time Calculation (min) 61 min   Activity Tolerance Patient tolerated treatment well   Behavior During Therapy [facility] for tasks assessed/performed      Past Medical History:  Diagnosis Date  . Cataract   . Hypertension     Past Surgical History:  Procedure Laterality Date  . EYE SURGERY    . VAGINAL HYSTERECTOMY  1991    There were no vitals filed for this visit.      Subjective Assessment - 04/29/17 0842    Subjective No pain right now.  She has mild pain at night in her back.  She does have some mild knee pain when she kneels to get up from the floor.     Currently in Pain? No/denies            [facility] - 04/29/17 0001      Strength   Right Hip Extension 5/5   Right Hip ABduction 4/5   Left Hip Extension 5/5   Left Hip ABduction 4/5                     [facility] Adult PT Treatment/Exercise - 04/29/17 0001      Self-Care   Self-Care Posture;Heat/Ice Application;Other Self-Care Comments   Posture hip hinging    Heat/Ice Application heat pack for muscele aches    Other Self-Care Comments  quick soft tissue compression along paraspinals , discussed massage as an option for pain relief, walking vs biking or elliptical for bone density      Therapeutic Activites    Therapeutic Activities (P)  Other Therapeutic Activities     Lumbar Exercises: Stretches   Lower Trunk Rotation 10 seconds   Lower Trunk Rotation Limitations x 10    Prone on Elbows Stretch 2 reps;20  seconds   Prone on Elbows Stretch Limitations pain in L arch      Lumbar Exercises: Aerobic   Tread Mill 1.8 mph with supervision and cues for safety, walking closer to the front of the belt and naturalizing gait      Lumbar Exercises: Supine   Ab Set 15 reps   Bent Knee Raise 20 reps     Modalities   Modalities Moist Heat     Moist Heat Therapy   Number Minutes Moist Heat 10 Minutes   Moist Heat Location Lumbar Spine                PT Education - 04/29/17 1027    Education provided Yes   Education Details HEP, posture, extension, walking for bone density , massage, hip hinging for sit to stand    Person(s) Educated Patient   Methods Explanation;Demonstration   Comprehension Verbalized understanding;Returned demonstration;Verbal cues required;Need further instruction          PT Short Term Goals - 04/29/17 1031      PT SHORT TERM GOAL #1   Title Pt will begin light walking program as tolerated  to improve bone heatth.    Status On-going           PT Long Term Goals - 04/29/17 1031      PT LONG TERM GOAL #1   Title Pt will be I with HEP for posture and core/hip strength    Status On-going     PT LONG TERM GOAL #2   Title Pt will demo and fully understand importance of avoiding flexion/rotation with ADLs, lifting to prevent fracture.    Status On-going     PT LONG TERM GOAL #3   Title Pt will be able to continue gardening, walking without limitation of back pain    Status On-going     PT LONG TERM GOAL #4   Title Pt will score <40% limited on FOTO    Status On-going               Plan - 04/29/17 1028    Clinical Impression Statement Patient has been lifting, bending with poor technique.  She commonly sits and bends to don shoes (observed today) and when gardening, stands to lift sticks, leaves.  She was able to walk on the TM today and do HEP without aggravating back pain.  She needed supervison and consistent cues for technique, husband  present to provdie reinforcement.  Good strength noted in hips today.  Excellent hip hinge when cued.    PT Next Visit Plan check hip strength, review questions about handout and practice , walk on TM, standing postural ex, eventually try prone with pillows , decompression?    PT Home Exercise Plan supine LTR, bent knee raise (scissors), Education from [employer] [facility]   Consulted and Agree with Plan of Care Patient   Family Member Consulted spouse      Patient will benefit from skilled therapeutic intervention in order to improve the following deficits and impairments:  Impaired flexibility, Decreased cognition, Increased fascial restricitons, Improper body mechanics, Pain, Decreased mobility, Postural dysfunction, Decreased strength, Decreased range of motion, Difficulty walking  Visit Diagnosis: Abnormal posture  Muscle weakness (generalized)     Problem List There are no active problems to display for this patient.   Yatzary Merriweather 04/29/2017, 10:35 AM  [facility] [address] [place], [place], [address] Phone: [phone]   Fax:  [phone]  Name: Kimberly Byrd MRN: [id number] Date of Birth: September 17, 1931  Karie Mainland, PT 04/29/17 10:35 AM Phone: [phone] Fax: [phone]

## 2017-05-01 ENCOUNTER — Ambulatory Visit: Payer: Medicare Other | Admitting: Physical Therapy

## 2017-05-01 DIAGNOSIS — M6281 Muscle weakness (generalized): Secondary | ICD-10-CM

## 2017-05-01 DIAGNOSIS — R293 Abnormal posture: Secondary | ICD-10-CM | POA: Diagnosis not present

## 2017-05-01 NOTE — Patient Instructions (Signed)
EXTENSION: Standing - Resistance Band: Stable (Active)    Stand, right arm at side. Against yellow resistance band, draw arm backward, as far as possible, keeping elbow straight. Complete _2__ sets of _10-20__ repetitions. Perform _1__ sessions per day.  Copyright  VHI. All rights reserved.    Low Row: Standing    Face anchor, feet shoulder width apart. Palms up, pull arms back, squeezing shoulder blades together. Repeat _10-20_ times per set. Do _1-2_ sets per session. Do _5-7_ sessions per week. Anchor Height: Waist  [email or web address].exer.[email or web address]   Copyright  VHI. All rights reserved.

## 2017-05-01 NOTE — Therapy (Signed)
[facility] [address] [place], [place], [address] Phone: [phone]   Fax:  [phone]  Physical Therapy Treatment  Patient Details  Name: Kimberly Byrd MRN: [id number] Date of Birth: August 02, 1931 Referring Provider: Dr. Talmage Coin   Encounter Date: 05/01/2017      PT End of Session - 05/01/17 1153    Visit Number 3   Number of Visits 8   Date for PT Re-Evaluation 05/29/17   PT Start Time 1107   PT Stop Time 1158   PT Time Calculation (min) 51 min   Activity Tolerance Patient tolerated treatment well   Behavior During Therapy [facility] for tasks assessed/performed      Past Medical History:  Diagnosis Date  . Cataract   . Hypertension     Past Surgical History:  Procedure Laterality Date  . EYE SURGERY    . VAGINAL HYSTERECTOMY  1991    There were no vitals filed for this visit.      Subjective Assessment - 05/01/17 1108    Subjective I am a bit late because I was out in the yard  picking up leaves.  I am thinking more about how I bend now. When I do have back pain is after been standing, working.  Mild discomfort, fatigue.    Currently in Pain? No/denies              [facility] Adult PT Treatment/Exercise - 05/01/17 0001      Lumbar Exercises: Aerobic   Tread Mill 2.0 mph self selected speed      Lumbar Exercises: Machines for Strengthening   Cybex Knee Extension 2 x 10 reps, 15 lbs    Leg Press 1 plate x 15 cues for control and abdominals     Lumbar Exercises: Standing   Heel Raises 20 reps   Functional Squats 20 reps   Wall Slides 10 reps   Wall Slides Limitations cues needed    Row Strengthening;Both;20 reps;Theraband   Theraband Level (Row) Level 3 (Green)   Shoulder Extension Strengthening;Both;20 reps;Theraband   Theraband Level (Shoulder Extension) Level 3 (Green)   Other Standing Lumbar Exercises march on foam x 10 each                 PT Education - 05/01/17 1150    Education  provided Yes   Education Details weight training, standing band ex for HEP, Treadmill    Person(s) Educated Patient   Methods Explanation   Comprehension Verbalized understanding;Returned demonstration          PT Short Term Goals - 04/29/17 1031      PT SHORT TERM GOAL #1   Title Pt will begin light walking program as tolerated to improve bone heatth.    Status On-going           PT Long Term Goals - 04/29/17 1031      PT LONG TERM GOAL #1   Title Pt will be I with HEP for posture and core/hip strength    Status On-going     PT LONG TERM GOAL #2   Title Pt will demo and fully understand importance of avoiding flexion/rotation with ADLs, lifting to prevent fracture.    Status On-going     PT LONG TERM GOAL #3   Title Pt will be able to continue gardening, walking without limitation of back pain    Status On-going     PT LONG TERM GOAL #4   Title Pt will  score <40% limited on FOTO    Status On-going               Plan - 05/01/17 1304    Clinical Impression Statement Pt was introduced to more gym-type strengthening and reinforced need for weightbearing for bone building. She was more confident with walking on the TM than she was the other day. No increase in pain.  She tends to move quickly and prefers to stay with what she is comfortable with.    PT Next Visit Plan review questions about handout and practice , walk on TM, standing postural ex, eventually try prone with pillows , decompression?    PT Home Exercise Plan supine LTR, bent knee raise (scissors), Education from [employer], standing row, ext green and red band    Consulted and Agree with Plan of Care Patient      Patient will benefit from skilled therapeutic intervention in order to improve the following deficits and impairments:  Impaired flexibility, Decreased cognition, Increased fascial restricitons, Improper body mechanics, Pain, Decreased mobility, Postural dysfunction, Decreased  strength, Decreased range of motion, Difficulty walking  Visit Diagnosis: Abnormal posture  Muscle weakness (generalized)     Problem List There are no active problems to display for this patient.   PAA,JENNIFER 05/01/2017, 1:18 PM  [facility] [address] [place], [place], [address] Phone: [phone]   Fax:  [phone]  Name: Ezzard FlaxMicki C Oki MRN: [id number] Date of Birth: 25-Feb-1931   Karie MainlandJennifer Paa, PT 05/01/17 1:19 PM Phone: [phone] Fax: [phone]

## 2017-05-06 ENCOUNTER — Ambulatory Visit: Payer: Medicare Other | Admitting: Physical Therapy

## 2017-05-06 ENCOUNTER — Encounter: Payer: Self-pay | Admitting: Physical Therapy

## 2017-05-06 DIAGNOSIS — R293 Abnormal posture: Secondary | ICD-10-CM

## 2017-05-06 DIAGNOSIS — M6281 Muscle weakness (generalized): Secondary | ICD-10-CM

## 2017-05-06 NOTE — Therapy (Signed)
[facility] [address] [place], [place], [address] Phone: [phone]   Fax:  [phone]  Physical Therapy Treatment  Patient Details  Name: Kimberly Byrd MRN: [id number] Date of Birth: 1931-03-05 Referring Provider: Dr. Talmage CoinJeffrey Kerr   Encounter Date: 05/06/2017      PT End of Session - 05/06/17 1155    Visit Number 4   Number of Visits 8   Date for PT Re-Evaluation 05/29/17   PT Start Time 1146   PT Stop Time 1238   PT Time Calculation (min) 52 min   Activity Tolerance Patient tolerated treatment well   Behavior [facility] for tasks assessed/performed      Past Medical History:  Diagnosis Date  . Cataract   . Hypertension     Past Surgical History:  Procedure Laterality Date  . EYE SURGERY    . VAGINAL HYSTERECTOMY  1991    There were no vitals filed for this visit.      Subjective Assessment - 05/06/17 1151    Subjective Has been to the gym a few times.  Has not used the treadmill, though. No pain.    Currently in Pain? No/denies             [facility]/Ex[facility] - 05/06/17 0001      Self-Care   Posture HEP reinforcement    Other Self-Care Comments  recommend TM first at the gym to increase bone building      Lumbar Exercises: Aerobic   Tread Mill 2.0 mph self selected speed   10 min , able to have conversation as she walked, 0% grade      Lumbar Exercises: Standing   Row Strengthening;Both;20 reps;Theraband   Theraband Level (Row) Level 3 (Green)   Shoulder Extension Strengthening;Both;20 reps;Theraband   Theraband Level (Shoulder Extension) Level 2 (Red)     Lumbar Exercises: Supine   Ab Set 10 reps   Bent Knee Raise 20 reps   Straight Leg Raise 10 reps     Lumbar Exercises: Sidelying   Hip Abduction 10 reps     Lumbar Exercises: Prone   Straight Leg Raise 10 reps     Modalities   Modalities Moist Heat     Moist Heat Therapy   Number Minutes Moist Heat 10  Minutes   Moist Heat Location Lumbar Spine                  PT Short Term Goals - 05/06/17 1235      PT SHORT TERM GOAL #1   Title Pt will begin light walking program as tolerated to improve bone heatth.    Baseline not on treadmill yet   Status On-going           PT Long Term Goals - 05/06/17 1235      PT LONG TERM GOAL #1   Title Pt will be I with HEP for posture and core/hip strength    Status On-going     PT LONG TERM GOAL #2   Title Pt will demo and fully understand importance of avoiding flexion/rotation with ADLs, lifting to prevent fracture.    Status On-going     PT LONG TERM GOAL #3   Title Pt will be able to continue gardening, walking without limitation of back pain    Status Achieved     PT LONG TERM GOAL #4   Title Pt will score <40% limited on FOTO    Status  On-going               Plan - 05/06/17 1230    Clinical Impression Statement Pt continues to have min to no pain.  She continues to need cues for technique of exercises, including use of the treadmill as it is out of her comfort zone.  She is very strong in her hips and knees but has deficits in care stability.  She will not need any new exercises as she will only do what she is comfortable with.    PT Next Visit Plan review questions about handout and practice , walk on TM, standing postural ex, eventually try prone, standing LE and core    PT Home Exercise Plan supine LTR, bent knee raise (scissors), Education from [employer], standing row, ext green and red band    Consulted and Agree with Plan of Care Patient      Patient will benefit from skilled therapeutic intervention in order to improve the following deficits and impairments:  Impaired flexibility, Decreased cognition, Increased fascial restricitons, Improper body mechanics, Pain, Decreased mobility, Postural dysfunction, Decreased strength, Decreased range of motion, Difficulty walking  Visit Diagnosis: Abnormal  posture  Muscle weakness (generalized)     Problem List There are no active problems to display for this patient.   Axel Meas 05/06/2017, 12:38 PM  [facility] [address] [place], [place], [address] Phone: [phone]   Fax:  [phone]  Name: Kimberly Byrd MRN: [id number] Date of Birth: [birth year]   Karie Mainland, PT 05/06/17 12:38 PM Phone: [phone] Fax: [phone]

## 2017-05-08 ENCOUNTER — Encounter: Payer: Self-pay | Admitting: Physical Therapy

## 2017-05-08 ENCOUNTER — Ambulatory Visit: Payer: Medicare Other | Admitting: Physical Therapy

## 2017-05-08 DIAGNOSIS — R293 Abnormal posture: Secondary | ICD-10-CM | POA: Diagnosis not present

## 2017-05-08 DIAGNOSIS — M6281 Muscle weakness (generalized): Secondary | ICD-10-CM

## 2017-05-08 NOTE — Patient Instructions (Signed)
FUNCTIONAL MOBILITY: Wall Squat    Stance: shoulder-width on floor, against wall. Place feet in front of hips. Bend hips and knees. Keep back straight. Do not allow knees to bend past toes. Squeeze glutes and quads to stand. __10_ reps per set, __1_ sets per day, __5_ days per week  Copyright  VHI. All rights reserved.    Resisted Horizontal Abduction: Bilateral    Sit or stand, tubing in both hands, arms out in front. Keeping arms straight, pinch shoulder blades together and stretch arms out. Repeat _10-20___ times per set. Do __2__ sets per session. Do __1__ sessions per day.  http://orth.exer.us/969   Copyright  VHI. All rights reserved.

## 2017-05-08 NOTE — Therapy (Signed)
[facility] [address] [place], [place], [address] Phone: [phone]   Fax:  [phone]  Physical Therapy Treatment  Patient Details  Name: Kimberly Byrd MRN: [id number] Date of Birth: July 11, 1931 Referring Provider: Dr. Talmage CoinJeffrey Kerr   Encounter Date: 05/08/2017      PT End of Session - 05/08/17 0928    Visit Number 5   Number of Visits 9   Date for PT Re-Evaluation 05/29/17   PT Start Time 0930   PT Stop Time 1015   PT Time Calculation (min) 45 min   Activity Tolerance Patient tolerated treatment well   Behavior During Therapy [facility] for tasks assessed/performed      Past Medical History:  Diagnosis Date  . Cataract   . Hypertension     Past Surgical History:  Procedure Laterality Date  . EYE SURGERY    . VAGINAL HYSTERECTOMY  1991    There were no vitals filed for this visit.      Subjective Assessment - 05/08/17 0929    Subjective No new complaints today.     Currently in Pain? No/denies            [facility] - 05/08/17 0001      Berg Balance Test   Sit to Stand Able to stand without using hands and stabilize independently   Standing Unsupported Able to stand safely 2 minutes   Sitting with Back Unsupported but Feet Supported on Floor or Stool Able to sit safely and securely 2 minutes   Stand to Sit Sits safely with minimal use of hands   Transfers Able to transfer safely, minor use of hands   Standing Unsupported with Eyes Closed Able to stand 10 seconds safely   Standing Ubsupported with Feet Together Able to place feet together independently and stand 1 minute safely   From Standing, Reach Forward with Outstretched Arm Can reach confidently >25 cm (10")   From Standing Position, Pick up Object from Floor Able to pick up shoe safely and easily   From Standing Position, Turn to Look Behind Over each Shoulder Looks behind from both sides and weight shifts well   Turn 360 Degrees Able to  turn 360 degrees safely in 4 seconds or less   Standing Unsupported, Alternately Place Feet on Step/Stool Able to stand independently and safely and complete 8 steps in 20 seconds   Standing Unsupported, One Foot in Front Able to place foot tandem independently and hold 30 seconds   Standing on One Leg Able to lift leg independently and hold > 10 seconds   Total Score 56   Berg comment: perfect score!!                      [facility] Adult PT Treatment/Exercise - 05/08/17 0001      Self-Care   Other Self-Care Comments  importance of neutral spine and body mechanics to avoid injury      Lumbar Exercises: Aerobic   Tread Mill 2.2 mph , 0-1% grade      Lumbar Exercises: Standing   Wall Slides 10 reps   Other Standing Lumbar Exercises mini wall squat hold with UE exercises: red band horizontal abduction x 2 sets      Shoulder Exercises: Standing   External Rotation Strengthening;Both;10 reps   Flexion Strengthening;Both;10 reps   ABduction Strengthening;Both;10 reps   Other Standing Exercises bicep curl x 20 bilateral  PT Education - 05/08/17 1001    Education provided Yes   Education Details resistance training, berg balance test and results    Person(s) Educated Patient   Methods Explanation   Comprehension Verbalized understanding;Returned demonstration          PT Short Term Goals - 05/06/17 1235      PT SHORT TERM GOAL #1   Title Pt will begin light walking program as tolerated to improve bone heatth.    Baseline not on treadmill yet   Status On-going           PT Long Term Goals - 05/08/17 1009      PT LONG TERM GOAL #1   Title Pt will be I with HEP for posture and core/hip strength    Status On-going     PT LONG TERM GOAL #2   Title Pt will demo and fully understand importance of avoiding flexion/rotation with ADLs, lifting to prevent fracture.    Baseline reinforced today , needs cues    Status On-going     PT LONG TERM  GOAL #3   Title Pt will be able to continue gardening, walking without limitation of back pain    Status Achieved     PT LONG TERM GOAL #4   Title Pt will score <40% limited on FOTO    Status On-going               Plan - 05/08/17 1431    Clinical Impression Statement Patient scored 56/56 on Berg balance test.  She needs cues for recall of exercises and rationale.  Used 3 lbs weights as she has at home for resistance trainng.  Goals in progress.    PT Next Visit Plan review questions about handout and practice , walk on TM, standing postural ex, eventually try prone, standing LE and core    PT Home Exercise Plan supine LTR, bent knee raise (scissors), Education from [employer], standing row, ext green and red band    Consulted and Agree with Plan of Care Patient      Patient will benefit from skilled therapeutic intervention in order to improve the following deficits and impairments:  Impaired flexibility, Decreased cognition, Increased fascial restricitons, Improper body mechanics, Pain, Decreased mobility, Postural dysfunction, Decreased strength, Decreased range of motion, Difficulty walking  Visit Diagnosis: Abnormal posture  Muscle weakness (generalized)     Problem List There are no active problems to display for this patient.   PAA,JENNIFER 05/08/2017, 2:41 PM  [facility] [address] [place], [place], [address] Phone: [phone]   Fax:  [phone]  Name: Kimberly Byrd MRN: [id number] Date of Birth: September 09, 1931  Karie Mainland, PT 05/08/17 2:41 PM Phone: [phone] Fax: [phone]

## 2017-05-13 ENCOUNTER — Ambulatory Visit: Payer: Medicare Other | Attending: Internal Medicine | Admitting: Physical Therapy

## 2017-05-13 DIAGNOSIS — M6281 Muscle weakness (generalized): Secondary | ICD-10-CM | POA: Insufficient documentation

## 2017-05-13 DIAGNOSIS — R293 Abnormal posture: Secondary | ICD-10-CM

## 2017-05-13 NOTE — Therapy (Signed)
[facility] [address] [place], [place], [address] Phone: [phone]   Fax:  [phone]  Physical Therapy Treatment  Patient Details  Name: Kimberly Byrd MRN: [id number] Date of Birth: 04/11/1931 Referring Provider: Dr. Jeffrey Kerr   Encounter Date: 05/13/2017      PT End of Session - 05/13/17 1017    Visit Number 6   Number of Visits 9   Date for PT Re-Evaluation 05/29/17   PT Start Time 1016   PT Stop Time 1100   PT Time Calculation (min) 44 min   Activity Tolerance Patient tolerated treatment well   Behavior During Therapy [facility] for tasks assessed/performed      Past Medical History:  Diagnosis Date  . Cataract   . Hypertension     Past Surgical History:  Procedure Laterality Date  . EYE SURGERY    . VAGINAL HYSTERECTOMY  1991    There were no vitals filed for this visit.      Subjective Assessment - 05/13/17 1253    Subjective No pain, has been to the gym a few times, walked on the treadmill and has been doing her exercises.     Currently in Pain? No/denies           [facility] Adult PT Treatment/Exercise - 05/13/17 0001      Lumbar Exercises: Stretches   Active Hamstring Stretch 2 reps;30 seconds   Lower Trunk Rotation 10 seconds   Lower Trunk Rotation Limitations x 10      Lumbar Exercises: Standing   Wall Slides 20 reps   Wall Slides Limitations multiple cues to achieve postiion properly    Row Strengthening;Both;20 reps;Theraband   Theraband Level (Row) Level 4 (Blue)   Shoulder Extension Strengthening;Both;20 reps;Theraband   Theraband Level (Shoulder Extension) Level 3 (Green)     Lumbar Exercises: Supine   Ab Set 10 reps   Bridge 10 reps   Straight Leg Raise 10 reps   Other Supine Lumbar Exercises iso hold table top 90/90 x 30 sec added heel taps      Lumbar Exercises: Sidelying   Hip Abduction 10 reps  2 sets      Shoulder Exercises: Supine   Horizontal ABduction  Strengthening;Both;20 reps;Theraband   Theraband Level (Shoulder Horizontal ABduction) Level 2 (Red)   External Rotation Strengthening;Both;15 reps;Theraband   Theraband Level (Shoulder External Rotation) Level 2 (Red)                PT Education - 05/13/17 1254    Education provided Yes   Education Details avoiding flexion and rotation, golfers lift, doing laundry with good mechanics , squatting vs bending.    Person(s) Educated Patient   Methods Explanation;Demonstration;Verbal cues;Handout   Comprehension Verbalized understanding;Returned demonstration;Need further instruction          PT Short Term Goals - 05/13/17 1046      PT SHORT TERM GOAL #1   Title Pt will begin light walking program as tolerated to improve bone heatth.    Baseline 20-30 min per patient at the gym    Status Partially Met           PT Long Term Goals - 05/13/17 1047      PT LONG TERM GOAL #1   Title Pt will be I with HEP for posture and core/hip strength    Status On-going     PT LONG TERM GOAL #2   Title Pt will demo and fully understand importance of   avoiding flexion/rotation with ADLs, lifting to prevent fracture.    Status Partially Met     PT LONG TERM GOAL #3   Title Pt will be able to continue gardening, walking without limitation of back pain    Baseline reports improvement this week, achiness usually after working in the yard.    Status Partially Met     PT LONG TERM GOAL #4   Title Pt will score <40% limited on FOTO    Status On-going               Plan - 05/13/17 1255    Clinical Impression Statement Doing well, reports being able to do her normal yardwork and less pain following.  Pain does not bother her while lifting, but gets achy afterwards.  She was able to demo proper Golfer's lift but will need reinforcement to make into a habit. No increase in pain, declined need for MHP.    PT Next Visit Plan review questions about handout and practice , walk on TM,  standing postural ex, eventually try prone, standing LE and core    PT Home Exercise Plan supine LTR, bent knee raise (scissors), Education from [employer], standing row, ext green and red band    Consulted and Agree with Plan of Care Patient      Patient will benefit from skilled therapeutic intervention in order to improve the following deficits and impairments:  Impaired flexibility, Decreased cognition, Increased fascial restricitons, Improper body mechanics, Pain, Decreased mobility, Postural dysfunction, Decreased strength, Decreased range of motion, Difficulty walking  Visit Diagnosis: Abnormal posture  Muscle weakness (generalized)     Problem List There are no active problems to display for this patient.   Jorryn Hershberger 05/13/2017, 12:59 PM  [facility] [address] [place], [place], [address] Phone: [phone]   Fax:  [phone]  Name: Kimberly Byrd MRN: [id number] Date of Birth: 1931/03/26  Raeford Razor, PT 05/13/17 12:59 PM Phone: [phone] Fax: [phone]

## 2017-05-13 NOTE — Patient Instructions (Addendum)
Reducing Load   Copyright  VHI. All rights reserved.  BODY MECHANICS Tips Good body mechanics are important during activities of daily living. The practice of good body mechanics will: -help distribute weight throughout the skeleton in a more anatomically correct manner thus stimulating more normal forces on the bones, and encouraging stronger, healthier, denser bones. -reduce unnatural forces on bones, ligaments, joints and muscles and reduce risk of fracture, other injury or back pain. A WORD ON BODY POSITIONING: Sitting is the hardest position for the back. Lying on the back is the easiest. Standing, in good body alignment, is somewhere between. A good motto is: Sit less, stand more, and, when you can't do that, lie down on your back and exercise to strengthen it.  Copyright  VHI. All rights reserved.       Supine to Sit (Active)   Lie on back, left leg bent. Roll to other side. From side-lying, sit up on side of bed. Complete ___ sets of ___ repetitions. Perform ___ sessions per day.  Copyright  VHI. All rights reserved.    Housework - Reaching Down   If you are unable to bend your knees or squat, use a lazy Darl PikesSusan to keep items within easy reach. Store only light, unbreakable items on the lowest shelves, and use a reacher to pick them up.  Copyright  VHI. All rights reserved.  Low Shelf   Squat down, and bring item close to lift.   Copyright  VHI. All rights reserved.  Lifting Principles .Maintain proper posture and head alignment. .Slide object as close as possible before lifting. .Move obstacles out of the way. .Test before lifting; ask for help if too heavy. .Tighten stomach muscles without holding breath. .Use smooth movements; do not jerk. .Use legs to do the work, and pivot with feet. .Distribute the work load symmetrically and close to the center of trunk. .Push instead of pull whenever possible.  Copyright  VHI. All rights reserved.  Posture -  Standing   Good posture is important. Avoid slouching and forward head thrust. Maintain curve in low back and align ears over shoul- ders, hips over ankles.   Copyright  VHI. All rights reserved.   Posture - Sitting   Sit upright, head facing forward. Try using a roll to support lower back. Keep shoulders relaxed, and avoid rounded back. Keep hips level with knees. Avoid crossing legs for long periods.   Copyright  VHI. All rights reserved.  Ideal Posture Use with figures on 3 (2 of 2): 1.Head erect 2.Chin in 3.Chest and navel aligned 4.Spinal curves maintained 5.Knees relaxed 6.Shoulders and hips aligned 7.Feet slightly apart 8.Toes and arches active 9.Abdomen taut (breathe with diaphragm) 10.Arms at sides Ideal posture is: -pain free. -achieved with practice, mindful interest, and body awareness.  Copyright  VHI. All rights reserved.     Move heavy items one at a time, or move portions of the contents.   Posture Awareness     Stand and check posture: Jut chin, pull back to comfortable position. Tilt pelvis forward, back; be sure back is not swayed. Roll from heels to balls of feet, then distribute your weight evenly. Picture a line through spine pulling you erect. Focus on breathing. Good Posture = Better Breathing. Check ____ times per day.  http://gt2.exer.us/873   Copyright  VHI. All rights reserved.    Reducing Load   Copyright  VHI. All rights reserved.  BODY MECHANICS Tips Good body mechanics are important during activities of  daily living. The practice of good body mechanics will: -help distribute weight throughout the skeleton in a more anatomically correct manner thus stimulating more normal forces on the bones, and encouraging stronger, healthier, denser bones. -reduce unnatural forces on bones, ligaments, joints and muscles and reduce risk of fracture, other injury or back pain. A WORD ON BODY POSITIONING: Sitting is the hardest position for the back.  Lying on the back is the easiest. Standing, in good body alignment, is somewhere between. A good motto is: Sit less, stand more, and, when you can't do that, lie down on your back and exercise to strengthen it.  Copyright  VHI. All rights reserved.       Supine to Sit (Active)   Lie on back, left leg bent. Roll to other side. From side-lying, sit up on side of bed. Complete ___ sets of ___ repetitions. Perform ___ sessions per day.  Copyright  VHI. All rights reserved.    Housework - Reaching Down   If you are unable to bend your knees or squat, use a lazy Darl Pikes to keep items within easy reach. Store only light, unbreakable items on the lowest shelves, and use a reacher to pick them up.  Copyright  VHI. All rights reserved.  Low Shelf   Squat down, and bring item close to lift.   Copyright  VHI. All rights reserved.  Lifting Principles .Maintain proper posture and head alignment. .Slide object as close as possible before lifting. .Move obstacles out of the way. .Test before lifting; ask for help if too heavy. .Tighten stomach muscles without holding breath. .Use smooth movements; do not jerk. .Use legs to do the work, and pivot with feet. .Distribute the work load symmetrically and close to the center of trunk. .Push instead of pull whenever possible.  Copyright  VHI. All rights reserved.  Posture - Standing   Good posture is important. Avoid slouching and forward head thrust. Maintain curve in low back and align ears over shoul- ders, hips over ankles.   Copyright  VHI. All rights reserved.   Posture - Sitting   Sit upright, head facing forward. Try using a roll to support lower back. Keep shoulders relaxed, and avoid rounded back. Keep hips level with knees. Avoid crossing legs for long periods.   Copyright  VHI. All rights reserved.  Ideal Posture Use with figures on 3 (2 of 2): 1.Head erect 2.Chin in 3.Chest and navel aligned 4.Spinal curves  maintained 5.Knees relaxed 6.Shoulders and hips aligned 7.Feet slightly apart 8.Toes and arches active 9.Abdomen taut (breathe with diaphragm) 10.Arms at sides Ideal posture is: -pain free. -achieved with practice, mindful interest, and body awareness.  Copyright  VHI. All rights reserved.     Move heavy items one at a time, or move portions of the contents.   Posture Awareness     Stand and check posture: Jut chin, pull back to comfortable position. Tilt pelvis forward, back; be sure back is not swayed. Roll from heels to balls of feet, then distribute your weight evenly. Picture a line through spine pulling you erect. Focus on breathing. Good Posture = Better Breathing. Check ____ times per day.  http://gt2.exer.us/873   Copyright  VHI. All rights reserved.

## 2017-05-15 ENCOUNTER — Ambulatory Visit: Payer: Medicare Other | Admitting: Physical Therapy

## 2017-05-15 DIAGNOSIS — R293 Abnormal posture: Secondary | ICD-10-CM | POA: Diagnosis not present

## 2017-05-15 DIAGNOSIS — M6281 Muscle weakness (generalized): Secondary | ICD-10-CM

## 2017-05-15 NOTE — Therapy (Signed)
[place], [place], [address] Phone: [phone]   Fax:  [phone]  Physical Therapy Treatment  Patient Details  Name: Kimberly Byrd MRN: [id number] Date of Birth: 01/26/1931 Referring Provider: Dr. Delrae Rend   Encounter Date: 05/15/2017      PT End of Session - 05/15/17 1014    Visit Number 7   Number of Visits 9   Date for PT Re-Evaluation 05/29/17   PT Start Time 2458   PT Stop Time 1109   PT Time Calculation (min) 54 min   Activity Tolerance Patient tolerated treatment well   Behavior During Therapy [facility] for tasks assessed/performed      Past Medical History:  Diagnosis Date  . Cataract   . Hypertension     Past Surgical History:  Procedure Laterality Date  . EYE SURGERY    . VAGINAL HYSTERECTOMY  1991    There were no vitals filed for this visit.      Subjective Assessment - 05/15/17 1030    Subjective Has been using the treadmilll at the gym, 15-20 min.  No complaints.    Currently in Pain? No/denies                         [facility] Adult PT Treatment/Exercise - 05/15/17 0001      Lumbar Exercises: Stretches   Lower Trunk Rotation Limitations x 10 legs on ball for obliques      Lumbar Exercises: Standing   Other Standing Lumbar Exercises alternating arm and leg 2 lbs high march    Other Standing Lumbar Exercises standing Airex march      Lumbar Exercises: Supine   Heel Slides 15 reps   Heel Slides Limitations used ball    Bridge 15 reps   Bridge Limitations used ball    Straight Leg Raise 10 reps   Straight Leg Raises Limitations legs on ball      Shoulder Exercises: Seated   Other Seated Exercises swiss ball: overhead red band, horiz pull x 15 and trunk rotation upper x 10   Other Seated Exercises maintain balance eyes closed narrow support      Shoulder Exercises: Standing   Flexion Strengthening;Both;15 reps   Shoulder Flexion Weight (lbs) 2   ABduction Strengthening;Both;15 reps   Shoulder ABduction Weight (lbs) 2   Other Standing Exercises bicep curl x 20 bilateral    Other Standing Exercises single arm circles 2 lbs , all ex done standing on foam AIrex to challenge balance      Moist Heat Therapy   Number Minutes Moist Heat 10 Minutes   Moist Heat Location Lumbar Spine                PT Education - 05/15/17 1100    Education provided Yes   Education Details stability ball, balance    Person(s) Educated Patient;Spouse   Methods Explanation   Comprehension Verbalized understanding;Returned demonstration          PT Short Term Goals - 05/13/17 1046      PT SHORT TERM GOAL #1   Title Pt will begin light walking program as tolerated to improve bone heatth.    Baseline 20-30 min per patient at the gym    Status Partially Met           PT Long Term Goals - 05/13/17 1047      PT LONG TERM GOAL #1   Title Pt  will be I with HEP for posture and core/hip strength    Status On-going     PT LONG TERM GOAL #2   Title Pt will demo and fully understand importance of avoiding flexion/rotation with ADLs, lifting to prevent fracture.    Status Partially Met     PT LONG TERM GOAL #3   Title Pt will be able to continue gardening, walking without limitation of back pain    Baseline reports improvement this week, achiness usually after working in the yard.    Status Partially Met     PT LONG TERM GOAL #4   Title Pt will score <40% limited on FOTO    Status On-going               Plan - 05/15/17 1013    Clinical Impression Statement Used props to challenge balance and stability, able to do with min cueing.  Did not need UE support for standing balance and Airex pad.    PT Treatment/Interventions ADLs/Self Care Home Management;Neuromuscular re-education;Manual techniques;Functional mobility training;Moist Heat;Cryotherapy;Therapeutic exercise;Passive range of motion;Electrical Stimulation;Patient/family  education   PT Next Visit Plan review questions about handout and practice , walk on TM, standing postural ex, eventually try prone, standing LE and core prep for DC end of the week    PT Home Exercise Plan supine LTR, bent knee raise (scissors), Education from [employer], standing row, ext green and red band    Consulted and Agree with Plan of Care Patient   Family Member Consulted spouse      Patient will benefit from skilled therapeutic intervention in order to improve the following deficits and impairments:  Impaired flexibility, Decreased cognition, Increased fascial restricitons, Improper body mechanics, Pain, Decreased mobility, Postural dysfunction, Decreased strength, Decreased range of motion, Difficulty walking  Visit Diagnosis: Abnormal posture  Muscle weakness (generalized)     Problem List There are no active problems to display for this patient.   Kimberly Byrd 05/15/2017, 11:02 AM  [place], [place], [address] Phone: [phone]   Fax:  [phone]  Name: Kimberly Byrd MRN: [id number] Date of Birth: [birth year]  Raeford Razor, PT 05/15/17 11:02 AM Phone: [phone] Fax: [phone]

## 2017-05-20 ENCOUNTER — Ambulatory Visit: Payer: Medicare Other | Admitting: Physical Therapy

## 2017-05-20 DIAGNOSIS — R293 Abnormal posture: Secondary | ICD-10-CM | POA: Diagnosis not present

## 2017-05-20 DIAGNOSIS — M6281 Muscle weakness (generalized): Secondary | ICD-10-CM

## 2017-05-20 NOTE — Therapy (Signed)
[place], [place], [address] Phone: [phone]   Fax:  [phone]  Physical Therapy Treatment and Discharge  Patient Details  Name: Kimberly Byrd MRN: [id number] Date of Birth: Nov 16, 1930 Referring Provider: Dr. Delrae Rend   Encounter Date: 05/20/2017      PT End of Session - 05/20/17 0926    Visit Number 8   Number of Visits 9   Date for PT Re-Evaluation 05/29/17   PT Start Time 0845   PT Stop Time 0935  about 5-10 min talking, FOTO    PT Time Calculation (min) 50 min   Activity Tolerance Patient tolerated treatment well   Behavior During Therapy [facility] for tasks assessed/performed      Past Medical History:  Diagnosis Date  . Cataract   . Hypertension     Past Surgical History:  Procedure Laterality Date  . EYE SURGERY    . VAGINAL HYSTERECTOMY  1991    There were no vitals filed for this visit.      Subjective Assessment - 05/20/17 0936    Subjective Did 16 min on the treadmill the other day, 1.9 mph.  No pain at all.  Feels ready to wrap up PT.    Currently in Pain? No/denies             [facility] Adult PT Treatment/Exercise - 05/20/17 0001      Self-Care   Other Self-Care Comments  breathing, proper body mech with supine to sit, full HEP   FOTO improvement, setting limits for pacing herself, progres     Lumbar Exercises: Stretches   Lower Trunk Rotation 10 seconds   Lower Trunk Rotation Limitations x 10      Lumbar Exercises: Supine   Ab Set 10 reps   Bridge 10 reps   Other Supine Lumbar Exercises iso hold table top 90/90 x 30 sec added heel taps    Other Supine Lumbar Exercises then added dead bug iso (UE and LE), then lower body scissors, bicycle x 10 each  for core strength      Shoulder Exercises: Standing   Flexion Strengthening;Both;15 reps   Shoulder Flexion Weight (lbs) 3   ABduction Strengthening;Both;15 reps   Shoulder ABduction Weight (lbs) 3   Extension  Strengthening;Both;20 reps   Theraband Level (Shoulder Extension) Level 2 (Red)   Row Strengthening;Both;20 reps   Theraband Level (Shoulder Row) Level 4 (Blue)   Other Standing Exercises multiple cues needed for technique and control of bands on release, stabilizing her body             PT Education - 05/20/17 0917    Education provided Yes   Education Details full HEP review and body mechanics , posture   Person(s) Educated Patient;Spouse   Methods Explanation;Demonstration;Handout   Comprehension Verbalized understanding;Returned demonstration;Verbal cues required          PT Short Term Goals - 05/13/17 1046      PT SHORT TERM GOAL #1   Title Pt will begin light walking program as tolerated to improve bone heatth.    Baseline 20-30 min per patient at the gym    Status Partially Met           PT Long Term Goals - 05/20/17 0850      PT LONG TERM GOAL #1   Title Pt will be I with HEP for posture and core/hip strength    Status Achieved     PT LONG TERM  GOAL #2   Title Pt will demo and fully understand importance of avoiding flexion/rotation with ADLs, lifting to prevent fracture.    Baseline needs occ cues due to memory, cognitive deficits    Status Partially Met     PT LONG TERM GOAL #3   Title Pt will be able to continue gardening, walking without limitation of back pain    Status Achieved     PT LONG TERM GOAL #4   Title Pt will score <40% limited on FOTO    Baseline 16%   Status Achieved               Plan - Jun 12, 2017 0937    Clinical Impression Statement Patient has met all LTGs and has her handouts and her husband to provide reinforcement of good body mechanics, posture.  She understands the importance of proper technique and setting limits for herself.  But she just loves to garden and sometimes gets very engrossed in what she's doing., overdoes it sometimes.  She has exceptional strength in her UE and LE.  Ready for DC to HEP and gym 3-5 times  per week.    PT Next Visit Plan DC, NA    PT Home Exercise Plan supine LTR, bent knee raise (scissors), Education from [employer], standing row, ext green and red band , dumbbells for UE    Consulted and Agree with Plan of Care Patient   Family Member Consulted spouse      Patient will benefit from skilled therapeutic intervention in order to improve the following deficits and impairments:     Visit Diagnosis: Abnormal posture  Muscle weakness (generalized)       G-Codes - 2017-06-12 0940    Functional Assessment Tool Used (Outpatient Only) FOTO   Functional Limitation Carrying, moving and handling objects   Carrying, Moving and Handling Objects Current Status (I0165) At least 1 percent but less than 20 percent impaired, limited or restricted   Carrying, Moving and Handling Objects Goal Status (V3748) At least 20 percent but less than 40 percent impaired, limited or restricted   Carrying, Moving and Handling Objects Discharge Status [phone]) At least 1 percent but less than 20 percent impaired, limited or restricted      Problem List There are no active problems to display for this patient.   PAA,JENNIFER 06/12/2017, 9:47 AM  [place], [place], [address] Phone: [phone]   Fax:  [phone]  Name: Kimberly Byrd MRN: [id number] Date of Birth: 01-May-1931  PHYSICAL THERAPY DISCHARGE SUMMARY  Visits from Start of Care: 8  Current functional level related to goals / functional outcomes: Goals met, needs cues due to cognitive deficits   Remaining deficits: None , not limited functionally.    Education / Equipment: Osteoporosis safety with lift, ADLs, [employer] handout , HEP, gym exercises  Plan: Patient agrees to discharge.  Patient goals were met. Patient is being discharged due to being pleased with the current functional level.  ?????    Raeford Razor, PT 2017/06/12 9:50  AM Phone: [phone] Fax: [phone]

## 2017-05-22 ENCOUNTER — Ambulatory Visit: Payer: Medicare Other | Admitting: Physical Therapy

## 2020-01-06 ENCOUNTER — Ambulatory Visit: Payer: Self-pay | Attending: Internal Medicine

## 2020-01-09 ENCOUNTER — Ambulatory Visit: Payer: Medicare PPO | Attending: Internal Medicine

## 2020-01-09 DIAGNOSIS — Z23 Encounter for immunization: Secondary | ICD-10-CM | POA: Insufficient documentation

## 2020-01-09 NOTE — Progress Notes (Signed)
   Covid-19 Vaccination Clinic  Name:  HAYDYN LIDDELL    MRN: [id number] DOB: Jul 26, 1931  01/09/2020  Ms. Pearman was observed post Covid-19 immunization for 15 minutes without incidence. She was provided with Vaccine Information Sheet and instruction to access the V-Safe system.   Ms. Ola was instructed to call 911 with any severe reactions post vaccine: Marland Kitchen Difficulty breathing  . Swelling of your face and throat  . A fast heartbeat  . A bad rash all over your body  . Dizziness and weakness    Immunizations Administered    Name Date Dose VIS Date Route   Pfizer COVID-19 Vaccine 01/09/2020  4:14 PM 0.3 mL 10/22/2019 Intramuscular   Manufacturer: [employer], [employer]   Lot: [id number]   NDC: [address]-5

## 2020-02-08 ENCOUNTER — Ambulatory Visit: Payer: Medicare PPO | Attending: Internal Medicine

## 2020-02-08 DIAGNOSIS — Z23 Encounter for immunization: Secondary | ICD-10-CM

## 2020-02-08 NOTE — Progress Notes (Signed)
   Covid-19 Vaccination Clinic  Name:  Kimberly Byrd    MRN: [id number] DOB: Feb 13, 1931  02/08/2020  [name] was observed post Covid-19 immunization for 15 minutes without incident. She was provided with Vaccine Information Sheet and instruction to access the V-Safe system.   [name] was instructed to call 911 with any severe reactions post vaccine: Marland Kitchen Difficulty breathing  . Swelling of face and throat  . A fast heartbeat  . A bad rash all over body  . Dizziness and weakness   Immunizations Administered    Name Date Dose VIS Date Route   Pfizer COVID-19 Vaccine 02/08/2020 10:01 AM 0.3 mL 10/22/2019 Intramuscular   Manufacturer: [employer], [employer]   Lot: [id number]   NDC: [address]-4

## 2020-04-19 ENCOUNTER — Encounter: Payer: Self-pay | Admitting: Neurology

## 2020-04-20 DIAGNOSIS — Z961 Presence of intraocular lens: Secondary | ICD-10-CM | POA: Diagnosis not present

## 2020-07-07 DIAGNOSIS — Z0000 Encounter for general adult medical examination without abnormal findings: Secondary | ICD-10-CM | POA: Diagnosis not present

## 2020-07-07 DIAGNOSIS — M549 Dorsalgia, unspecified: Secondary | ICD-10-CM | POA: Diagnosis not present

## 2020-07-07 DIAGNOSIS — I10 Essential (primary) hypertension: Secondary | ICD-10-CM | POA: Diagnosis not present

## 2020-07-07 DIAGNOSIS — F419 Anxiety disorder, unspecified: Secondary | ICD-10-CM | POA: Diagnosis not present

## 2020-07-07 DIAGNOSIS — F0390 Unspecified dementia without behavioral disturbance: Secondary | ICD-10-CM | POA: Diagnosis not present

## 2020-07-07 DIAGNOSIS — E1169 Type 2 diabetes mellitus with other specified complication: Secondary | ICD-10-CM | POA: Diagnosis not present

## 2020-07-07 DIAGNOSIS — E7800 Pure hypercholesterolemia, unspecified: Secondary | ICD-10-CM | POA: Diagnosis not present

## 2020-07-07 DIAGNOSIS — E559 Vitamin D deficiency, unspecified: Secondary | ICD-10-CM | POA: Diagnosis not present

## 2020-07-07 DIAGNOSIS — M810 Age-related osteoporosis without current pathological fracture: Secondary | ICD-10-CM | POA: Diagnosis not present

## 2020-07-20 ENCOUNTER — Ambulatory Visit (INDEPENDENT_AMBULATORY_CARE_PROVIDER_SITE_OTHER): Payer: Medicare PPO

## 2020-07-20 ENCOUNTER — Other Ambulatory Visit: Payer: Self-pay

## 2020-07-20 ENCOUNTER — Encounter: Payer: Self-pay | Admitting: Podiatry

## 2020-07-20 ENCOUNTER — Ambulatory Visit: Payer: Medicare PPO | Admitting: Podiatry

## 2020-07-20 DIAGNOSIS — B351 Tinea unguium: Secondary | ICD-10-CM

## 2020-07-20 DIAGNOSIS — M79674 Pain in right toe(s): Secondary | ICD-10-CM | POA: Diagnosis not present

## 2020-07-20 DIAGNOSIS — M79675 Pain in left toe(s): Secondary | ICD-10-CM | POA: Diagnosis not present

## 2020-07-20 DIAGNOSIS — M79672 Pain in left foot: Secondary | ICD-10-CM | POA: Diagnosis not present

## 2020-07-20 NOTE — Progress Notes (Signed)
Subjective:   Patient ID: Kimberly Byrd, female   DOB: 86 y.o.   MRN: [id number]   HPI Patient presents with thickened damaged nailbeds 1-5 both feet that has gotten worse recently with patient who does have dementia and is not understanding the condition.  Presents with husband and patient does not smoke likes to be active   Review of Systems  All other systems reviewed and are negative.       Objective:  Physical Exam Vitals and nursing note reviewed.  Constitutional:      Appearance: She is well-developed.  Pulmonary:     Effort: Pulmonary effort is normal.  Musculoskeletal:        General: Normal range of motion.  Skin:    General: Skin is warm.  Neurological:     Mental Status: She is alert.     Neurovascular status intact muscle strength found to be adequate range of motion within normal limits.  Patient is noted to have thickened dystrophic deformed nailbeds 1-5 both feet that date into the skin creating irritation and redness.  Patient has good digital perfusion well oriented x3     Assessment:  Chronic mycotic nail infections with thick yellow brittle nailbeds 1-5 both feet     Plan:  H&P reviewed condition recommended correction of deformity.  Today I went ahead and discussed the correction and I debrided nailbeds 1-5 both feet and this will be done routinely every 3 months and she is encouraged to call with questions concerns which may arise

## 2020-07-24 ENCOUNTER — Emergency Department (HOSPITAL_COMMUNITY)
Admission: EM | Admit: 2020-07-24 | Discharge: 2020-07-24 | Disposition: A | Discharge status: Home / Self Care | Payer: Medicare PPO | Attending: Emergency Medicine | Admitting: Emergency Medicine

## 2020-07-24 ENCOUNTER — Emergency Department (HOSPITAL_COMMUNITY): Payer: Medicare PPO

## 2020-07-24 ENCOUNTER — Other Ambulatory Visit: Payer: Self-pay

## 2020-07-24 DIAGNOSIS — Y999 Unspecified external cause status: Secondary | ICD-10-CM | POA: Insufficient documentation

## 2020-07-24 DIAGNOSIS — Z23 Encounter for immunization: Secondary | ICD-10-CM | POA: Diagnosis not present

## 2020-07-24 DIAGNOSIS — S0181XA Laceration without foreign body of other part of head, initial encounter: Secondary | ICD-10-CM

## 2020-07-24 DIAGNOSIS — Y939 Activity, unspecified: Secondary | ICD-10-CM | POA: Insufficient documentation

## 2020-07-24 DIAGNOSIS — Z79899 Other long term (current) drug therapy: Secondary | ICD-10-CM | POA: Diagnosis not present

## 2020-07-24 DIAGNOSIS — Y929 Unspecified place or not applicable: Secondary | ICD-10-CM | POA: Diagnosis not present

## 2020-07-24 DIAGNOSIS — S022XXA Fracture of nasal bones, initial encounter for closed fracture: Secondary | ICD-10-CM

## 2020-07-24 DIAGNOSIS — S0101XA Laceration without foreign body of scalp, initial encounter: Secondary | ICD-10-CM | POA: Diagnosis not present

## 2020-07-24 DIAGNOSIS — S0992XA Unspecified injury of nose, initial encounter: Secondary | ICD-10-CM | POA: Diagnosis present

## 2020-07-24 DIAGNOSIS — S81811A Laceration without foreign body, right lower leg, initial encounter: Secondary | ICD-10-CM | POA: Diagnosis not present

## 2020-07-24 DIAGNOSIS — W01198A Fall on same level from slipping, tripping and stumbling with subsequent striking against other object, initial encounter: Secondary | ICD-10-CM | POA: Diagnosis not present

## 2020-07-24 DIAGNOSIS — W19XXXA Unspecified fall, initial encounter: Secondary | ICD-10-CM

## 2020-07-24 DIAGNOSIS — S0990XA Unspecified injury of head, initial encounter: Secondary | ICD-10-CM

## 2020-07-24 DIAGNOSIS — I10 Essential (primary) hypertension: Secondary | ICD-10-CM | POA: Diagnosis not present

## 2020-07-24 DIAGNOSIS — S8991XA Unspecified injury of right lower leg, initial encounter: Secondary | ICD-10-CM | POA: Diagnosis not present

## 2020-07-24 MED ORDER — LIDOCAINE HCL (PF) 1 % IJ SOLN
30.0000 mL | Freq: Once | INTRAMUSCULAR | Status: AC
  Administered 2020-07-24: 30 mL
  Filled 2020-07-24: qty 30

## 2020-07-24 MED ORDER — TETANUS-DIPHTH-ACELL PERTUSSIS 5-2.5-18.5 LF-MCG/0.5 IM SUSP
0.5000 mL | Freq: Once | INTRAMUSCULAR | Status: AC
  Administered 2020-07-24: 0.5 mL via INTRAMUSCULAR
  Filled 2020-07-24: qty 0.5

## 2020-07-24 NOTE — ED Provider Notes (Signed)
[facility]-EMERGENCY DEPT Provider Note   CSN: [id number] Arrival date & time: 07/24/20  1406     History Chief Complaint  Patient presents with   Kimberly Byrd is a 86 y.o. female.  Kimberly Byrd is a 86 y.o. female with a history of hypertension, cataracts, dementia, who presents to the emergency department after fall.  Patient brought into the ED by her husband with a large laceration to the forehead and shin with some active bleeding.  When asked what happened patient states she does not remember, and her husband states that she was walking down the back steps of their house out to her driveway when she missed a step and fell forward hitting her head and shin.  He does not think that she lost consciousness.  He states that she has dementia at baseline but has been more confused since the fall.  Has not had any nausea vomiting.  Patient not complaining of a headache and does not have any numbness weakness or other neurologic deficits.  She has a laceration to the anterior right shin, does not have any focal arthralgias, no pain over the hips and patient ambulatory and able to get up after the fall.  No neck or back pain.  She has a few abrasions on the hand but no lacerations and not complaining of any hand or wrist pain.  Patient is not on any blood thinners.  Husband thinks tetanus is up-to-date but is not sure.  No other aggravating or alleviating factors.        Past Medical History:  Diagnosis Date   Cataract    Hypertension     There are no problems to display for this patient.   Past Surgical History:  Procedure Laterality Date   EYE SURGERY     VAGINAL HYSTERECTOMY  1991     OB History   No obstetric history on file.     Family History  Problem Relation Age of Onset   Breast cancer Mother    Heart disease Brother     Social History   Tobacco Use   Smoking status: Never Smoker   Smokeless tobacco: Never Used    Substance Use Topics   Alcohol use: No   Drug use: No    Home Medications Prior to Admission medications   Medication Sig Start Date End Date Taking? Authorizing Provider  alendronate (FOSAMAX) 70 MG tablet Take 70 mg by mouth every 7 (seven) days. Take with a full glass of water on an empty stomach.    [provider]  amLODipine (NORVASC) 5 MG tablet Take 5 mg by mouth daily. 06/19/15   [provider]  atenolol (TENORMIN) 50 MG tablet Take 50 mg by mouth daily.     [provider]  Cholecalciferol 2000 UNITS CAPS Take 2,000 Units by mouth daily.    [provider]  ezetimibe (ZETIA) 10 MG tablet Take 10 mg by mouth at bedtime.     [provider]    Allergies    Levofloxacin, Penicillins, Sulfa antibiotics, Denosumab, Epinephrine, and Lipitor [atorvastatin calcium]  Review of Systems   Review of Systems  Constitutional: Negative for chills and fever.  HENT: Positive for facial swelling. Negative for nosebleeds.   Eyes: Negative for visual disturbance.  Respiratory: Negative for shortness of breath.   Cardiovascular: Negative for chest pain.  Gastrointestinal: Negative for abdominal pain, nausea and vomiting.  Musculoskeletal: Negative for arthralgias, back pain,  joint swelling and neck pain.  Skin: Positive for wound.  Neurological: Negative for dizziness, syncope, weakness, light-headedness, numbness and headaches.    Physical Exam Updated Vital Signs BP (!) 158/63 (BP Location: Left Arm)    Pulse 65    Temp 98.3 F (36.8 C) (Oral)    Resp 18    SpO2 98%   Physical Exam Vitals and nursing note reviewed.  Constitutional:      General: She is not in acute distress.    Appearance: She is well-developed. She is not diaphoretic.     Comments: Pleasantly demented, in no acute distress  HENT:     Head: Normocephalic. Laceration present.      Comments: Deep complex laceration to the forehead as pictured above, patient able to  raise eyebrows without difficulty.  Does not extend to the bridge of the nose.    Nose: Signs of injury present.     Comments: Tenderness and swelling over the bridge of the nose with slight abrasion, no obvious deviation, no larger laceration, no epistaxis or septal hematoma    Mouth/Throat:     Comments: No loose or missing teeth, no tenderness or malocclusion of the jaw Eyes:     General:        Right eye: No discharge.        Left eye: No discharge.     Pupils: Pupils are equal, round, and reactive to light.  Neck:     Comments: No midline C-spine tenderness, normal range of motion Cardiovascular:     Rate and Rhythm: Normal rate and regular rhythm.     Heart sounds: Normal heart sounds.  Pulmonary:     Effort: Pulmonary effort is normal. No respiratory distress.     Breath sounds: Normal breath sounds. No wheezing or rales.     Comments: Respirations equal and unlabored, patient able to speak in full sentences, lungs clear to auscultation bilaterally, no chest wall tenderness Chest:     Chest wall: No tenderness.  Abdominal:     General: Bowel sounds are normal. There is no distension.     Palpations: Abdomen is soft. There is no mass.     Tenderness: There is no abdominal tenderness. There is no guarding.     Comments: Abdomen soft, nondistended, nontender to palpation in all quadrants without guarding or peritoneal signs  Musculoskeletal:        General: No deformity.     Cervical back: Normal range of motion and neck supple. No tenderness.       Legs:     Comments: V-shaped laceration to the anterior right shin with some active bleeding and overlying skin tear, no joint involvement, full range of motion of the knee and ankle.  2+ DP and TP pulses, normal sensation. No tenderness over the hips.  All joints supple and easily movable, all compartments soft.  No midline spinal tenderness.  Skin:    General: Skin is warm and dry.     Capillary Refill: Capillary refill takes less  than 2 seconds.  Neurological:     Mental Status: She is alert and oriented to person, place, and time.     Coordination: Coordination normal.     Comments: Speech is clear, able to follow commands CN III-XII intact Normal strength in upper and lower extremities bilaterally including dorsiflexion and plantar flexion, strong and equal grip strength Sensation normal to light and sharp touch Moves extremities without ataxia, coordination intact  Psychiatric:  Mood and Affect: Mood normal.        Behavior: Behavior normal.     ED Results / Procedures / Treatments   Labs (all labs ordered are listed, but only abnormal results are displayed) Labs Reviewed - No data to display  EKG None  Radiology DG Tibia/Fibula Right  Result Date: 07/24/2020 CLINICAL DATA:  Pain following fall EXAM: RIGHT TIBIA AND FIBULA - 2 VIEW COMPARISON:  None. FINDINGS: Frontal and lateral views were obtained. There is soft tissue defect anterior to the junction of the proximal and mid thirds of the tibia, a presumed soft tissue injury. No fracture or dislocation. No abnormal periosteal reaction. The joint spaces appear unremarkable. IMPRESSION: Apparent soft tissue injury anterior to the junction of the proximal and mid thirds of the tibia. No fracture or dislocation. No appreciable arthropathy. Electronically Signed   By: Bretta Bang III M.D.   On: 07/24/2020 14:46   CT Head Wo Contrast  Result Date: 07/24/2020 CLINICAL DATA:  Ross Ludwig on concrete driveway. Hit head. Laceration to head. EXAM: CT HEAD WITHOUT CONTRAST CT MAXILLOFACIAL WITHOUT CONTRAST TECHNIQUE: Multidetector CT imaging of the head and maxillofacial structures were performed using the standard protocol without intravenous contrast. Multiplanar CT image reconstructions of the maxillofacial structures were also generated. COMPARISON:  MR head 09/30/2016, CT head 09/05/2015. FINDINGS: CT HEAD FINDINGS Brain: Cerebral ventricle sizes are  stable and concordant with the degree of cerebral volume loss. No evidence of acute infarction. No parenchymal hemorrhage. No mass lesion. No extra-axial collection. No mass effect or midline shift. No hydrocephalus. Basilar cisterns are patent. Vascular: No hyperdense vessel or unexpected calcification. Skull: Soft tissue defect of the midline frontal scalp. Punctate density may represent a retained radiopaque foreign body superficially within the soft tissues (4:15). No underlying subgaleal hematoma. Negative for fracture or focal lesion. Other: None. CT MAXILLOFACIAL FINDINGS Osseous: Comminuted medially displaced by 2.5 mm left nasal bone fracture. No other acute displaced fracture identified. At least mild-to-moderate right temporomandibular joint degenerative changes. Orbits: Bilateral lens replacement. No traumatic or inflammatory finding. Sinuses: No air-fluid levels identified. Mild mucosal wall thickening of the left maxillary sinus. Otherwise paranasal sinuses the mastoid air cells are clear. Soft tissues: Subcutaneous soft tissue edema and hematoma along the nasal bridge. Other: The visualized cervical spine demonstrates grade 1 anterolisthesis of C4 on C5. Multilevel degenerative changes of the spine is noted. IMPRESSION: 1. No acute intracranial abnormality. 2. Comminuted and 90mm medially displaced left nasal bone fracture. 3. Frontal scalp laceration with punctate density that likely represents a retained radiopaque foreign body. Electronically Signed   By: Tish Frederickson M.D.   On: 07/24/2020 15:22   CT Maxillofacial Wo Contrast  Result Date: 07/24/2020 CLINICAL DATA:  Ross Ludwig on concrete driveway. Hit head. Laceration to head. EXAM: CT HEAD WITHOUT CONTRAST CT MAXILLOFACIAL WITHOUT CONTRAST TECHNIQUE: Multidetector CT imaging of the head and maxillofacial structures were performed using the standard protocol without intravenous contrast. Multiplanar CT image reconstructions of the  maxillofacial structures were also generated. COMPARISON:  MR head 09/30/2016, CT head 09/05/2015. FINDINGS: CT HEAD FINDINGS Brain: Cerebral ventricle sizes are stable and concordant with the degree of cerebral volume loss. No evidence of acute infarction. No parenchymal hemorrhage. No mass lesion. No extra-axial collection. No mass effect or midline shift. No hydrocephalus. Basilar cisterns are patent. Vascular: No hyperdense vessel or unexpected calcification. Skull: Soft tissue defect of the midline frontal scalp. Punctate density may represent a retained radiopaque foreign body superficially within the soft  tissues (4:15). No underlying subgaleal hematoma. Negative for fracture or focal lesion. Other: None. CT MAXILLOFACIAL FINDINGS Osseous: Comminuted medially displaced by 2.5 mm left nasal bone fracture. No other acute displaced fracture identified. At least mild-to-moderate right temporomandibular joint degenerative changes. Orbits: Bilateral lens replacement. No traumatic or inflammatory finding. Sinuses: No air-fluid levels identified. Mild mucosal wall thickening of the left maxillary sinus. Otherwise paranasal sinuses the mastoid air cells are clear. Soft tissues: Subcutaneous soft tissue edema and hematoma along the nasal bridge. Other: The visualized cervical spine demonstrates grade 1 anterolisthesis of C4 on C5. Multilevel degenerative changes of the spine is noted. IMPRESSION: 1. No acute intracranial abnormality. 2. Comminuted and 85mm medially displaced left nasal bone fracture. 3. Frontal scalp laceration with punctate density that likely represents a retained radiopaque foreign body. Electronically Signed   By: Tish Frederickson M.D.   On: 07/24/2020 15:22    Procedures .Marland KitchenLaceration Repair  Date/Time: 07/24/2020 6:17 PM Performed by: Dartha Lodge, PA-C Authorized by: Dartha Lodge, PA-C   Consent:    Consent obtained:  Verbal   Consent given by:  Patient and spouse   Risks discussed:   Infection, pain, poor cosmetic result, poor wound healing and need for additional repair   Alternatives discussed:  No treatment Anesthesia (see MAR for exact dosages):    Anesthesia method:  Local infiltration   Local anesthetic:  Lidocaine 1% w/o epi Laceration details:    Location:  Face   Face location:  Forehead   Length (cm):  8 (Horizontal laceration across the forehead with 2 vertical portions on either side traveling down towards the bridge of the nose)   Depth (mm):  10 Exploration:    Hemostasis achieved with:  Direct pressure   Wound exploration: entire depth of wound probed and visualized     Wound extent: areolar tissue violated   Treatment:    Area cleansed with:  Saline   Amount of cleaning:  Extensive   Irrigation solution:  Sterile saline   Irrigation volume:  500   Irrigation method:  Pressure wash Skin repair:    Repair method:  Sutures   Suture size:  5-0   Wound skin closure material used: Vicryl Rapide.   Suture technique:  Simple interrupted   Number of sutures:  19 (6 deep sutures, 13 superficial sutures) Approximation:    Approximation:  Close Post-procedure details:    Dressing:  Bulky dressing   Patient tolerance of procedure:  Tolerated well, no immediate complications .Marland KitchenLaceration Repair  Date/Time: 07/24/2020 6:18 PM Performed by: Dartha Lodge, PA-C Authorized by: Dartha Lodge, PA-C   Consent:    Consent obtained:  Verbal   Consent given by:  Patient and spouse   Risks discussed:  Infection, pain, poor cosmetic result and poor wound healing   Alternatives discussed:  No treatment Anesthesia (see MAR for exact dosages):    Anesthesia method:  Local infiltration   Local anesthetic:  Lidocaine 1% w/o epi Laceration details:    Location:  Leg   Leg location:  R lower leg (V shaped laceration over the anterior mid shin, deeper tissue involvement with overlying skin tear)   Length (cm):  4   Depth (mm):  7 Repair type:    Repair type:   Simple Pre-procedure details:    Preparation:  Imaging obtained to evaluate for foreign bodies Exploration:    Hemostasis achieved with:  Direct pressure   Wound exploration: wound explored through full range of motion and  entire depth of wound probed and visualized     Wound extent: areolar tissue violated     Wound extent: no underlying fracture noted   Treatment:    Area cleansed with:  Saline   Amount of cleaning:  Standard   Irrigation method:  Pressure wash Skin repair:    Repair method:  Sutures   Suture size:  4-0   Suture material:  Plain gut   Suture technique:  Simple interrupted   Number of sutures:  17 (7 deep, 10 superficial, tacking overyling skin tear in place) Approximation:    Approximation:  Close Post-procedure details:    Dressing:  Bulky dressing and non-adherent dressing   Patient tolerance of procedure:  Tolerated well, no immediate complications   (including critical care time)  Medications Ordered in ED Medications  lidocaine (PF) (XYLOCAINE) 1 % injection 30 mL (30 mLs Infiltration Given by Other 07/24/20 1714)  Tdap (BOOSTRIX) injection 0.5 mL (0.5 mLs Intramuscular Given 07/24/20 1715)    ED Course  I have reviewed the triage vital signs and the nursing notes.  Pertinent labs & imaging results that were available during my care of the patient were reviewed by me and considered in my medical decision making (see chart for details).    MDM Rules/Calculators/A&P                         86 year old female presents after mechanical fall which large complex laceration to the forehead and laceration to the anterior shin.  Not on anticoagulation.  No LOC or focal neurologic deficits.  Dementia with some confusion at baseline.  On arrival patient mildly hypertensive but vitals otherwise normal.  No midline spinal tenderness.  Will get CT of the head and maxillofacial bones as there is some swelling and pain over the nose with concern for potential fracture,  no obvious malocclusion of the jaw.  We will also get x-ray of the right tib-fib, patient with V-shaped laceration of the structures with overlying skin tear of the right anterior mid shin.  Imaging shows no acute intracranial injury or abnormality.  Patient does have a comminuted and medially displaced left nasal bone fracture.  Large forehead laceration noted.  X-rays with no underlying fracture, dislocation or radiopaque foreign body.   Discussed reassuring imaging with patient and husband.  Both lacerations were anesthetized with lidocaine and copiously irrigated with pressure wash.  Layered closure of anterior shin wound with good hemostasis, overlying skin intact in place with absorbable plain gut suture.  Layered closure of forehead laceration completed with Vicryl Rapide with good hemostasis and cosmesis.  Some surrounding bruising and hematoma.  Discussed appropriate wound care with husband, all sutures are absorbable and will not require suture removal.  Discussed strict infection precautions.  Dressings applied.  Will have patient follow-up with ENT regarding nasal bone fracture.  Discharged home in good condition.  Final Clinical Impression(s) / ED Diagnoses Final diagnoses:  Fall, initial encounter  Injury of head, initial encounter  Laceration of forehead, initial encounter  Laceration of skin of right lower leg, initial encounter  Closed fracture of nasal bone, initial encounter    Rx / DC Orders ED Discharge Orders    None       Legrand RamsFord, Torie Priebe N, PA-C 07/24/20 2143    Sabino DonovanKatz, Eric C, MD 07/25/20 [id number]

## 2020-07-24 NOTE — Discharge Instructions (Signed)
Your imaging today was reassuring overall it did show a small fracture of your nasal bones around the bridge of your nose, you will need to follow-up with the ENT specialist regarding this in a few days, call tomorrow to schedule an appointment.  You can use Tylenol and apply ice to help with pain and swelling.  Avoid straws and nose blowing.  Please leave dressings intact for the next 24 hours after that you can shower and wash your forehead and leg gently with mild soap, change dressing twice daily.  Sutures are dissolvable and will go away on their own in about 7 to 10 days.  If sutures are becoming brittle but have not fallen out you can rub these gently with a damp towel or washcloth and they should easily come out on their own.  Monitor for signs of infection such as redness, swelling, increasing pain or drainage.

## 2020-07-24 NOTE — ED Notes (Signed)
Pt fell when she missed a step on the concret driveway. Pt hit head. Lac to head and right knee. Pt is not on blood thinners.

## 2020-07-24 NOTE — ED Notes (Signed)
Pt discharged from this ED in stable condition at this time. All discharge instructions and follow up care reviewed with pt with no further questions at this time. Pt ambulatory to baseline, clear speech.   

## 2020-07-24 NOTE — ED Notes (Signed)
Pt to CT

## 2020-07-26 ENCOUNTER — Ambulatory Visit: Payer: Medicare PPO | Admitting: Neurology

## 2020-07-26 ENCOUNTER — Encounter: Payer: Self-pay | Admitting: Neurology

## 2020-07-26 ENCOUNTER — Other Ambulatory Visit: Payer: Self-pay

## 2020-07-26 VITALS — BP 142/63 | HR 60 | Ht 62.0 in | Wt 135.6 lb

## 2020-07-26 DIAGNOSIS — G301 Alzheimer's disease with late onset: Secondary | ICD-10-CM | POA: Diagnosis not present

## 2020-07-26 DIAGNOSIS — F0281 Dementia in other diseases classified elsewhere with behavioral disturbance: Secondary | ICD-10-CM | POA: Diagnosis not present

## 2020-07-26 MED ORDER — ESCITALOPRAM OXALATE 5 MG PO TABS: 5.0000 mg | ORAL_TABLET | Freq: Every day | ORAL | 11 refills | Status: AC

## 2020-07-26 NOTE — Patient Instructions (Signed)
1. Start Lexapro (Escitalopram) 5mg : take 1 tablet daily  2. Continue all your medications  3. Use the antibiotic ointment over the wound/stitches on your forehead  4. Continue close supervision, have husband check behind on medications  5. Follow-up in 6 months, call for any changes   FALL PRECAUTIONS: Be cautious when walking. Scan the area for obstacles that may increase the risk of trips and falls. When getting up in the mornings, sit up at the edge of the bed for a few minutes before getting out of bed. Consider elevating the bed at the head end to avoid drop of blood pressure when getting up. Walk always in a well-lit room (use night lights in the walls). Avoid area rugs or power cords from appliances in the middle of the walkways. Use a walker or a cane if necessary and consider physical therapy for balance exercise. Get your eyesight checked regularly.  HOME SAFETY: Consider the safety of the kitchen when operating appliances like stoves, microwave oven, and blender. Consider having supervision and share cooking responsibilities until no longer able to participate in those. Accidents with firearms and other hazards in the house should be identified and addressed as well.   ABILITY TO BE LEFT ALONE: If patient is unable to contact 911 operator, consider using LifeLine, or when the need is there, arrange for someone to stay with patients. Smoking is a fire hazard, consider supervision or cessation. Risk of wandering should be assessed by caregiver and if detected at any point, supervision and safe proof recommendations should be instituted.  MEDICATION SUPERVISION: Inability to self-administer medication needs to be constantly addressed. Implement a mechanism to ensure safe administration of the medications.  RECOMMENDATIONS FOR ALL PATIENTS WITH MEMORY PROBLEMS: 1. Continue to exercise (Recommend 30 minutes of walking everyday, or 3 hours every week) 2. Increase social interactions -  continue going to [place] and enjoy social gatherings with friends and family 3. Eat healthy, avoid fried foods and eat more fruits and vegetables 4. Maintain adequate blood pressure, blood sugar, and blood cholesterol level. Reducing the risk of stroke and cardiovascular disease also helps promoting better memory. 5. Avoid stressful situations. Live a simple life and avoid aggravations. Organize your time and prepare for the next day in anticipation. 6. Sleep well, avoid any interruptions of sleep and avoid any distractions in the bedroom that may interfere with adequate sleep quality 7. Avoid sugar, avoid sweets as there is a strong link between excessive sugar intake, diabetes, and cognitive impairment The Mediterranean diet has been shown to help patients reduce the risk of progressive memory disorders and reduces cardiovascular risk. This includes eating fish, eat fruits and green leafy vegetables, nuts like almonds and hazelnuts, walnuts, and also use olive oil. Avoid fast foods and fried foods as much as possible. Avoid sweets and sugar as sugar use has been linked to worsening of memory function.  There is always a concern of gradual progression of memory problems. If this is the case, then we may need to adjust level of care according to patient needs. Support, both to the patient and caregiver, should then be put into place.

## 2020-07-26 NOTE — Progress Notes (Signed)
NEUROLOGY CONSULTATION NOTE  Kimberly Byrd MRN: [id number] DOB: Jun 22, 1931  Referring provider: Dr. Tally Joe Primary care provider: Dr. Tally Joe  Reason for consult:  Cognitive impairment  Dear Dr Azucena Cecil:  Thank you for your kind referral of Kimberly Byrd for consultation of the above symptoms. Although her history is well known to you, please allow me to reiterate it for the purpose of our medical record. The patient was accompanied to the clinic by her husband who also provides collateral information. Records and images were personally reviewed where available.   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 Her husband of 27 years is present to provide additional information. When asked about her memory, she states that "if I am interested, I have not forgotten at all." Her husband started noticing memory changes 2-3 years ago. 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 She misplaces things frequently, putting things in the wrong places. She has a hard time staying focused on the conversation and with her activities. She is noted to trail off with sentences, slightly tangential. She manages her own medications and he states she seems to be organized with her pillbox, she is very methodical, he observes her but does not check/count pills. He helps her with the weekly alendronate. She stopped driving a year ago on her own accord, she was not getting lost but her husband noted she was driving very slowly. He manages finances. She states she fixes meals and he eats, he shakes his head and reports he manages meals. He reports personality changes, he likes to walk outside, she does not exercise, so when he comes back after an hour and a  half, she is angry at him for being gone. They talk about it and she quiets down. She states her mood is fine. No paranoia or hallucinations. She fell down steps 2 days ago and has stitches on her forehead and bilateral periorbital hematomas. He reports that she refuses to use the antibiotic ointment given and rubs lotion. She is independent with dressing and bathing. She does not do any chores (ie laundry, dishes). No family history of dementia. No other head injuries in the past. No alcohol use.  She denies any headaches, dizziness, diplopia, dysarthria/dysphagia, neck/back pain, focal numbness/tingling/weakness, bowel/bladder dysfunction, anosmia. She has a history of essential tremor and has a head tremor and bilateral hand tremors. She states tremor does not affect using a cup/utensils. She reports sleep is good, her husband notes she wakes up 2 times to use the bathroom.  I personally reviewed head CT without contrast done 07/24/20 and MRI brain in 2017 which showed mild to moderate diffuse volume loss, minimal chronic microvascular disease, no acute changes.      PAST MEDICAL HISTORY: Past Medical History:  Diagnosis Date  . Cataract   . Hypertension     PAST SURGICAL HISTORY: Past Surgical History:  Procedure Laterality Date  . EYE SURGERY    . VAGINAL HYSTERECTOMY  1991    MEDICATIONS: Current Outpatient Medications on File Prior to Visit  Medication Sig Dispense Refill  . alendronate (FOSAMAX) 70 MG tablet Take 70 mg by mouth every 7 (seven) days. Take with a full glass of water on an empty stomach.    Marland Kitchen amLODipine (NORVASC) 5 MG tablet Take  5 mg by mouth daily.    Marland Kitchen atenolol (TENORMIN) 50 MG tablet Take 50 mg by mouth daily.     . Cholecalciferol 2000 UNITS CAPS Take 2,000 Units by mouth daily.    Marland Kitchen ezetimibe (ZETIA) 10 MG tablet Take 10 mg by mouth at bedtime.      No current facility-administered medications on file prior to visit.    ALLERGIES: Allergies  Allergen  Reactions  . Levofloxacin Rash  . Penicillins Swelling    Also gets rash  . Sulfa Antibiotics Rash  . Denosumab   . Epinephrine   . Lipitor [Atorvastatin Calcium] Other (See Comments)    Leg cramps    FAMILY HISTORY: Family History  Problem Relation Age of Onset  . Breast cancer Mother   . Heart disease Brother     SOCIAL HISTORY: Social History   Socioeconomic History  . Marital status: Married    Spouse name: Not on file  . Number of children: Not on file  . Years of education: Not on file  . Highest education level: Not on file  Occupational History  . Not on file  Tobacco Use  . Smoking status: Never Smoker  . Smokeless tobacco: Never Used  Vaping Use  . Vaping Use: Never used  Substance and Sexual Activity  . Alcohol use: No  . Drug use: No  . Sexual activity: Not on file  Other Topics Concern  . Not on file  Social History Narrative   Right handed    Lives at home with husband    Social Determinants of Health   Financial Resource Strain:   . Difficulty of Paying Living Expenses: Not on file  Food Insecurity:   . Worried About Programme researcher, broadcasting/film/video in the Last Year: Not on file  . Ran Out of Food in the Last Year: Not on file  Transportation Needs:   . Lack of Transportation (Medical): Not on file  . Lack of Transportation (Non-Medical): Not on file  Physical Activity:   . Days of Exercise per Week: Not on file  . Minutes of Exercise per Session: Not on file  Stress:   . Feeling of Stress : Not on file  Social Connections:   . Frequency of Communication with Friends and Family: Not on file  . Frequency of Social Gatherings with Friends and Family: Not on file  . Attends Religious Services: Not on file  . Active Member of Clubs or Organizations: Not on file  . Attends Banker Meetings: Not on file  . Marital Status: Not on file  Intimate Partner Violence:   . Fear of Current or Ex-Partner: Not on file  . Emotionally Abused: Not on  file  . Physically Abused: Not on file  . Sexually Abused: Not on file    PHYSICAL EXAM: Vitals:   07/26/20 1034  BP: (!) 142/63  Pulse: 60  SpO2: 96%   General: No acute distress Head:  Healing wound with stitches over the glabellar region, bilateral periorbital hematomas Skin/Extremities: No rash, no edema Neurological Exam: Mental status: alert and oriented to person, state. Year is 47. No dysarthria or aphasia, at times tangential speech, repeats herself several times. Fund of knowledge is reduced.  Recent and remote memory are impaired.  Attention and concentration are reduced. SLUMS score 4/30.  [employer] Mental Exam 07/26/2020  Weekday Correct 0  Current year 0  What state are we in? 1  Amount spent 0  Amount left 0  #  of Animals 1  5 objects recall 0  Number series 0  Hour markers 0  Time correct 0  Placed X in triangle correctly 1  Largest Figure 1  Name of female 0  Date back to work 0  Type of work 0  State she lived in 0  Total score 4   Cranial nerves: CN I: not tested CN II: pupils equal, round and reactive to light, visual fields intact CN III, IV, VI:  full range of motion, no nystagmus, no ptosis CN V: facial sensation intact CN VII: upper and lower face symmetric CN VIII: hearing intact to conversation Bulk & Tone: normal, no fasciculations, no cogwheeling Motor: 5/5 throughout with no pronator drift. Sensation: intact to light touch, cold.  Romberg test negative Deep Tendon Reflexes: +2 throughout Cerebellar: no incoordination on finger to nose testing Gait: slightly unsteady, no ataxia Tremor: yes head tremor, bilateral high frequency low amplitude postural and endpoint tremor, R>L. 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 Her neurological exam is non-focal, she has a tremor consistent with essential tremor, no parkinsonian signs. SLUMS score today 4/30. Brain imaging reviewed with them. She has dementia, likely due to Alzheimer's disease. We discussed the diagnosis of dementia, including prognosis and role of medications. We discussed expectations and side effects of medications used in dementia, would avoid cholinesterase inhibitors due to bradycardia. We discussed mood changes that occur with dementia, and at this point we agreed on starting an SSRI which may be overall helpful for her compared to Memantine. Side effects of Lexapro 5mg  daily were discussed, may increase if tolerated. Husband instructed to check behind her on medications. She was advised to use the antibiotic ointment for her wound/stitches and said she would not use it. She does not drive. Dementia resources/information provided to husband. Follow-up in 6 months, they know to call for any changes.    Thank you for allowing me to participate in the care of this patient. Please do not hesitate to call for any questions or concerns.   , M.D.  CC: Dr. Patrcia Dolly

## 2020-07-28 DIAGNOSIS — G301 Alzheimer's disease with late onset: Secondary | ICD-10-CM | POA: Diagnosis not present

## 2020-07-28 DIAGNOSIS — F0281 Dementia in other diseases classified elsewhere with behavioral disturbance: Secondary | ICD-10-CM | POA: Diagnosis not present

## 2020-07-28 DIAGNOSIS — S022XXD Fracture of nasal bones, subsequent encounter for fracture with routine healing: Secondary | ICD-10-CM | POA: Diagnosis not present

## 2020-07-28 DIAGNOSIS — T148XXA Other injury of unspecified body region, initial encounter: Secondary | ICD-10-CM | POA: Diagnosis not present

## 2020-07-28 DIAGNOSIS — Z23 Encounter for immunization: Secondary | ICD-10-CM | POA: Diagnosis not present

## 2020-08-04 ENCOUNTER — Ambulatory Visit (INDEPENDENT_AMBULATORY_CARE_PROVIDER_SITE_OTHER): Payer: Medicare PPO | Admitting: Otolaryngology

## 2020-08-15 ENCOUNTER — Ambulatory Visit: Payer: Medicare PPO | Attending: Internal Medicine

## 2020-08-15 DIAGNOSIS — Z23 Encounter for immunization: Secondary | ICD-10-CM

## 2020-08-15 NOTE — Progress Notes (Signed)
   Covid-19 Vaccination Clinic  Name:  Kimberly Byrd    MRN: [id number] DOB: May 23, 1931  08/15/2020  Kimberly Byrd was observed post Covid-19 immunization for 15 minutes without incident. She was provided with Vaccine Information Sheet and instruction to access the V-Safe system.   Kimberly Byrd was instructed to call 911 with any severe reactions post vaccine: Marland Kitchen Difficulty breathing  . Swelling of face and throat  . A fast heartbeat  . A bad rash all over body  . Dizziness and weakness

## 2020-08-16 DIAGNOSIS — Z6825 Body mass index (BMI) 25.0-25.9, adult: Secondary | ICD-10-CM | POA: Diagnosis not present

## 2020-08-16 DIAGNOSIS — Z01419 Encounter for gynecological examination (general) (routine) without abnormal findings: Secondary | ICD-10-CM | POA: Diagnosis not present

## 2020-10-25 ENCOUNTER — Ambulatory Visit: Payer: Medicare PPO | Admitting: Podiatry

## 2020-11-09 ENCOUNTER — Other Ambulatory Visit: Payer: Self-pay | Admitting: Podiatry

## 2020-11-09 DIAGNOSIS — M79672 Pain in left foot: Secondary | ICD-10-CM

## 2020-11-09 DIAGNOSIS — B351 Tinea unguium: Secondary | ICD-10-CM

## 2020-11-09 DIAGNOSIS — M79674 Pain in right toe(s): Secondary | ICD-10-CM

## 2021-01-11 DIAGNOSIS — E1169 Type 2 diabetes mellitus with other specified complication: Secondary | ICD-10-CM | POA: Diagnosis not present

## 2021-01-11 DIAGNOSIS — M810 Age-related osteoporosis without current pathological fracture: Secondary | ICD-10-CM | POA: Diagnosis not present

## 2021-01-11 DIAGNOSIS — I10 Essential (primary) hypertension: Secondary | ICD-10-CM | POA: Diagnosis not present

## 2021-01-11 DIAGNOSIS — F419 Anxiety disorder, unspecified: Secondary | ICD-10-CM | POA: Diagnosis not present

## 2021-01-11 DIAGNOSIS — J309 Allergic rhinitis, unspecified: Secondary | ICD-10-CM | POA: Diagnosis not present

## 2021-01-11 DIAGNOSIS — E7800 Pure hypercholesterolemia, unspecified: Secondary | ICD-10-CM | POA: Diagnosis not present

## 2021-01-11 DIAGNOSIS — G301 Alzheimer's disease with late onset: Secondary | ICD-10-CM | POA: Diagnosis not present

## 2021-01-11 DIAGNOSIS — E559 Vitamin D deficiency, unspecified: Secondary | ICD-10-CM | POA: Diagnosis not present

## 2021-01-11 DIAGNOSIS — F0281 Dementia in other diseases classified elsewhere with behavioral disturbance: Secondary | ICD-10-CM | POA: Diagnosis not present

## 2021-02-12 ENCOUNTER — Ambulatory Visit: Payer: Medicare PPO | Admitting: Neurology

## 2021-02-12 ENCOUNTER — Telehealth: Payer: Self-pay | Admitting: Neurology

## 2021-02-12 NOTE — Telephone Encounter (Signed)
Tried to call pt husband back no answer no voice mail will try to call again later

## 2021-02-12 NOTE — Telephone Encounter (Signed)
Pls let husband know that her PCP can also prescribe the medication Lexapro. There is nothing over the counter that is an equivalent.

## 2021-02-12 NOTE — Telephone Encounter (Signed)
Patient's husband called to check about patients appointment and see if she cancelled it. He states that she is confused and doesn't understand what the appointments are for but that he also knows that if he schedules another follow up that she likely won't go to it. So he understand if Dr Karel Jarvis cannot continue to refill her medication but is asking if there is something over the counter he could give to his wife that might help her when the time comes that Dr Karel Jarvis isn't able to refill the medication. Please call husband. He states if he isn't able to answer the phone that he will call back tomorrow mid-morning

## 2021-02-12 NOTE — Telephone Encounter (Signed)
Spoke with pt husband and informed him that her PCP can also prescribe the medication Lexapro. There is nothing over the counter that is an equivalent. He verbalized understanding,

## 2021-02-27 DIAGNOSIS — R58 Hemorrhage, not elsewhere classified: Secondary | ICD-10-CM | POA: Diagnosis not present

## 2021-02-27 DIAGNOSIS — L308 Other specified dermatitis: Secondary | ICD-10-CM | POA: Diagnosis not present

## 2021-02-27 DIAGNOSIS — L853 Xerosis cutis: Secondary | ICD-10-CM | POA: Diagnosis not present

## 2021-03-02 ENCOUNTER — Ambulatory Visit: Payer: Medicare PPO | Admitting: Neurology

## 2021-03-12 ENCOUNTER — Ambulatory Visit: Payer: Medicare PPO | Admitting: Podiatry

## 2021-03-12 ENCOUNTER — Encounter: Payer: Self-pay | Admitting: Podiatry

## 2021-03-12 ENCOUNTER — Other Ambulatory Visit: Payer: Self-pay

## 2021-03-12 DIAGNOSIS — M79675 Pain in left toe(s): Secondary | ICD-10-CM | POA: Diagnosis not present

## 2021-03-12 DIAGNOSIS — B351 Tinea unguium: Secondary | ICD-10-CM

## 2021-03-12 DIAGNOSIS — M79674 Pain in right toe(s): Secondary | ICD-10-CM | POA: Diagnosis not present

## 2021-03-12 DIAGNOSIS — L84 Corns and callosities: Secondary | ICD-10-CM

## 2021-03-13 NOTE — Progress Notes (Signed)
Subjective:   Patient ID: Kimberly Byrd, female   DOB: 87 y.o.   MRN: [id number]   HPI Patient presents with severe nail disease 1-5 both feet and lesion plantar aspect both feet painful when pressed with caregiver with her today.  States they are all sore and make it hard to wear shoe gear comfortably neuro   ROS      Objective:  Physical Exam  Vascular status intact with patient found to have plantar keratotic lesion second metatarsal bilateral painful when pressed and thick yellow brittle nailbeds 1-5 both feet     Assessment:  Mycotic nail infection with pain 1-5 both feet with lesion formation bilateral      Plan:  H&P reviewed condition with her caregiver and debrided lesions bilateral and nails 1-5 both feet no iatrogenic bleeding and reappoint routine care as needed

## 2021-04-23 DIAGNOSIS — L853 Xerosis cutis: Secondary | ICD-10-CM | POA: Diagnosis not present

## 2021-04-23 DIAGNOSIS — Z961 Presence of intraocular lens: Secondary | ICD-10-CM | POA: Diagnosis not present

## 2021-04-23 DIAGNOSIS — L570 Actinic keratosis: Secondary | ICD-10-CM | POA: Diagnosis not present

## 2021-04-23 DIAGNOSIS — L308 Other specified dermatitis: Secondary | ICD-10-CM | POA: Diagnosis not present

## 2021-04-23 DIAGNOSIS — H26492 Other secondary cataract, left eye: Secondary | ICD-10-CM | POA: Diagnosis not present

## 2021-06-02 ENCOUNTER — Other Ambulatory Visit: Payer: Self-pay

## 2021-06-02 ENCOUNTER — Emergency Department (HOSPITAL_COMMUNITY): Payer: Medicare PPO

## 2021-06-02 ENCOUNTER — Emergency Department (HOSPITAL_COMMUNITY)
Admission: EM | Admit: 2021-06-02 | Discharge: 2021-06-02 | Disposition: A | Discharge status: Home / Self Care | Payer: Medicare PPO | Attending: Emergency Medicine | Admitting: Emergency Medicine

## 2021-06-02 DIAGNOSIS — Z79899 Other long term (current) drug therapy: Secondary | ICD-10-CM | POA: Diagnosis not present

## 2021-06-02 DIAGNOSIS — M25461 Effusion, right knee: Secondary | ICD-10-CM | POA: Diagnosis not present

## 2021-06-02 DIAGNOSIS — I10 Essential (primary) hypertension: Secondary | ICD-10-CM | POA: Insufficient documentation

## 2021-06-02 DIAGNOSIS — S86911A Strain of unspecified muscle(s) and tendon(s) at lower leg level, right leg, initial encounter: Secondary | ICD-10-CM | POA: Diagnosis not present

## 2021-06-02 DIAGNOSIS — M1711 Unilateral primary osteoarthritis, right knee: Secondary | ICD-10-CM | POA: Diagnosis not present

## 2021-06-02 DIAGNOSIS — W19XXXA Unspecified fall, initial encounter: Secondary | ICD-10-CM | POA: Insufficient documentation

## 2021-06-02 DIAGNOSIS — S8991XA Unspecified injury of right lower leg, initial encounter: Secondary | ICD-10-CM | POA: Diagnosis present

## 2021-06-02 MED ORDER — ACETAMINOPHEN 325 MG PO TABS
650.0000 mg | ORAL_TABLET | Freq: Four times a day (QID) | ORAL | Status: DC | PRN
  Administered 2021-06-02: 650 mg via ORAL
  Filled 2021-06-02: qty 2

## 2021-06-02 NOTE — ED Provider Notes (Signed)
Emergency Medicine Provider Triage Evaluation Note  Kimberly Byrd , a 87 y.o. female  was evaluated in triage.  Pt complains of R knee pain. Pt had a fall a few days ago. No injury at that time.  Patient's knee started hurting worse yesterday, it was initially intermittent.  Today it was severe to the point where patient was unable to walk.  She does not normally use an assistive device, but used a walker today to try and get around.  Review of Systems  Positive: R knee pain Negative: fever  Physical Exam  There were no vitals taken for this visit. Gen:   Awake, no distress   Resp:  Normal effort  MSK:   Minimal swelling of the right knee when compared to the left.  No erythema or warmth.  Patient able to range without difficulty or pain.   Medical Decision Making  Medically screening exam initiated at 12:07 PM.  Appropriate orders placed.  Alexei C Mulcahey was informed that the remainder of the evaluation will be completed by another provider, this initial triage assessment does not replace that evaluation, and the importance of remaining in the ED until their evaluation is complete.  Xray ordered   Alveria Apley, PA-C 06/02/21 1222    Cheryll Cockayne, MD 06/03/21 1106

## 2021-06-02 NOTE — ED Triage Notes (Signed)
Pt's family reports a fall on Wednesday, now has R knee pain. Pt did not have initial pain, but now is unable to walk per family.

## 2021-06-02 NOTE — Progress Notes (Signed)
Orthopedic Tech Progress Note Patient Details:  Kimberly Byrd [birth year] [id number]  Ortho Devices Type of Ortho Device: Knee Sleeve Ortho Device/Splint Location: RLE Ortho Device/Splint Interventions: Ordered, Application   Post Interventions Patient Tolerated: Well Instructions Provided: Adjustment of device  Jais Demir A Naomi Castrogiovanni 06/02/2021, 5:59 PM

## 2021-06-02 NOTE — ED Provider Notes (Signed)
[facility] EMERGENCY DEPARTMENT Provider Note   CSN: [id number] Arrival date & time: 06/02/21  1122     History Chief Complaint  Patient presents with   Fall   Knee Pain    Kimberly Byrd is a 87 y.o. female.  The history is provided by the patient and the spouse.  Fall  Knee Pain Kimberly Byrd is a 87 y.o. female who presents to the Emergency Department complaining of fall, knee pain. She presents the emergency department accompanied by her husband for evaluation of knee pain. She had a fall a couple of days ago. Two days ago she started to complain of pain to the right knee. Pain has worsened over the last 24 hours and now she has difficulty walking. No head injury. She has a history of dementia. No recent illnesses. No fevers, nausea, vomiting, dysuria. She did take Tylenol this morning but continued to complain of pain with ambulation.    Past Medical History:  Diagnosis Date   Cataract    Hypertension     There are no problems to display for this patient.   Past Surgical History:  Procedure Laterality Date   EYE SURGERY     VAGINAL HYSTERECTOMY  1991     OB History   No obstetric history on file.     Family History  Problem Relation Age of Onset   Breast cancer Mother    Heart disease Brother     Social History   Tobacco Use   Smoking status: Never   Smokeless tobacco: Never  Vaping Use   Vaping Use: Never used  Substance Use Topics   Alcohol use: No   Drug use: No    Home Medications Prior to Admission medications   Medication Sig Start Date End Date Taking? Authorizing Provider  alendronate (FOSAMAX) 70 MG tablet Take 70 mg by mouth every 7 (seven) days. Take with a full glass of water on an empty stomach.    [provider]  amLODipine (NORVASC) 5 MG tablet Take 5 mg by mouth daily. 06/19/15   [provider]  atenolol (TENORMIN) 50 MG tablet Take 50 mg by mouth daily.     [provider]   Cholecalciferol 2000 UNITS CAPS Take 2,000 Units by mouth daily.    [provider]  escitalopram (LEXAPRO) 5 MG tablet Take 1 tablet (5 mg total) by mouth daily. 07/26/20   Van Clines, MD  ezetimibe (ZETIA) 10 MG tablet Take 10 mg by mouth at bedtime.     [provider]  triamcinolone cream (KENALOG) 0.1 % Apply topically 2 (two) times daily as needed. 02/27/21   [provider]    Allergies    Levofloxacin, Penicillins, Sulfa antibiotics, Denosumab, Epinephrine, and Lipitor [atorvastatin calcium]  Review of Systems   Review of Systems  All other systems reviewed and are negative.  Physical Exam Updated Vital Signs BP (!) 190/96 (BP Location: Right Arm) Comment: pt not following commands to relax arm while obtaining BP  Pulse 86   Temp 97.8 F (36.6 C) (Oral)   Resp 17   SpO2 98%   Physical Exam Vitals and nursing note reviewed.  Constitutional:      Appearance: She is well-developed.  HENT:     Head: Normocephalic and atraumatic.  Cardiovascular:     Rate and Rhythm: Normal rate and regular rhythm.     Heart sounds: No murmur heard. Pulmonary:     Effort: Pulmonary effort  is normal. No respiratory distress.     Breath sounds: Normal breath sounds.  Abdominal:     Palpations: Abdomen is soft.     Tenderness: There is no abdominal tenderness. There is no guarding or rebound.  Musculoskeletal:     Comments: 2+ DP pulses bilaterally. There is mild soft tissue swelling to the right knee without any bony tenderness. There is no tenderness over the hips bilaterally. Flexion extension is intact throughout the right hip, right knee without difficulty. She has no pain on range of motion.  Skin:    General: Skin is warm and dry.  Neurological:     Mental Status: She is alert.     Comments: Five out of five strength in all four extremities.   Psychiatric:        Behavior: Behavior normal.    ED Results / Procedures / Treatments   Labs (all labs  ordered are listed, but only abnormal results are displayed) Labs Reviewed - No data to display  EKG None  Radiology DG Knee Complete 4 Views Right  Result Date: 06/02/2021 CLINICAL DATA:  fall, knee pain EXAM: RIGHT KNEE - COMPLETE 4+ VIEW COMPARISON:  July 24, 2020 FINDINGS: No acute fracture or dislocation. Unchanged appearance of the superior aspect of the patella with a well corticated ossific density likely reflecting the sequela of remote prior trauma. Enthesophyte of the quadriceps tendon. Mild degenerative changes of the lateral compartment. No area of erosion or osseous destruction. No unexpected radiopaque foreign body. Vascular calcifications. Moderate joint effusion. IMPRESSION: There is a moderate joint effusion without radiographically evident acute osseous abnormality. Electronically Signed   By: Meda Klinefelter MD   On: 06/02/2021 13:09    Procedures Procedures   Medications Ordered in ED Medications - No data to display   ED Course  I have reviewed the triage vital signs and the nursing notes.  Pertinent labs & imaging results that were available during my care of the patient were reviewed by me and considered in my medical decision making (see chart for details).    MDM Rules/Calculators/A&P                          patient here for evaluation of knee pain after a fall a few days ago. She has no significant tenderness on examination. She does have pain on ambulation. She is able to fully range the hip and knee. No evidence of acute fracture. She does have a moderate effusion on imaging. Presentation is not consistent with septic or gouty arthritis. Plan to treat with knee sleeve with weight-bearing as tolerated, orthopedics follow-up and return precautions.  Final Clinical Impression(s) / ED Diagnoses Final diagnoses:  Strain of right knee, initial encounter    Rx / DC Orders ED Discharge Orders     None        Tilden Fossa, MD 06/02/21  2353

## 2021-06-02 NOTE — ED Notes (Signed)
Reviewed discharge instructions with patient and spouse. Follow-up care reviewed. Patient and spouse verbalized understanding. Patient A&Ox4, VSS, and ambulatory with steady gait upon discharge. 

## 2021-06-02 NOTE — ED Notes (Signed)
Called ortho to notify of knee brace/sleeve order

## 2021-06-21 DIAGNOSIS — E559 Vitamin D deficiency, unspecified: Secondary | ICD-10-CM | POA: Diagnosis not present

## 2021-06-21 DIAGNOSIS — G301 Alzheimer's disease with late onset: Secondary | ICD-10-CM | POA: Diagnosis not present

## 2021-06-21 DIAGNOSIS — F0281 Dementia in other diseases classified elsewhere with behavioral disturbance: Secondary | ICD-10-CM | POA: Diagnosis not present

## 2021-06-21 DIAGNOSIS — M810 Age-related osteoporosis without current pathological fracture: Secondary | ICD-10-CM | POA: Diagnosis not present

## 2021-06-21 DIAGNOSIS — J309 Allergic rhinitis, unspecified: Secondary | ICD-10-CM | POA: Diagnosis not present

## 2021-06-21 DIAGNOSIS — F419 Anxiety disorder, unspecified: Secondary | ICD-10-CM | POA: Diagnosis not present

## 2021-06-21 DIAGNOSIS — I10 Essential (primary) hypertension: Secondary | ICD-10-CM | POA: Diagnosis not present

## 2021-06-21 DIAGNOSIS — E7800 Pure hypercholesterolemia, unspecified: Secondary | ICD-10-CM | POA: Diagnosis not present

## 2021-06-21 DIAGNOSIS — E1169 Type 2 diabetes mellitus with other specified complication: Secondary | ICD-10-CM | POA: Diagnosis not present

## 2021-06-28 DIAGNOSIS — M25561 Pain in right knee: Secondary | ICD-10-CM | POA: Diagnosis not present

## 2021-06-28 NOTE — Progress Notes (Signed)
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  She has tremor consistent with essential tremor, without parkinsonian signs.  She is on SSRI, which has been increased by her PCP to Lexapro 10 mg daily, overall helpful for her compared to using memantine.  Her behavior is controlled.  Husband is interested in home health for 24/7 care.     Recommendations:  Discussed safety both in and out of the home.  Contacted Misty [place], SW to further be able to answer questions regarding home health to this nice patient, she needs 24/7 care.  Also, information about [facility] has been given, and the telephone of Sidney Ace, BSW, lead navigator of [facility] was handed to the patient's husband. Discussed the importance of regular daily schedule Continue to monitor mood by PCP with Lexapro 10 mg daily Stay active at least 30 minutes at least 3 times a week.  Naps should be scheduled and should be no longer than 60 minutes and should not occur after 2 PM.  Mediterranean diet is recommended  Follow up in 6 months.   Subjective:   ED visits since last seen:  06/02/21 R knee strain adfter mechanical fall, negative workup/ imaging Hospital admissions: none  Kimberly Byrd is a 87 y.o. female  seen today in follow up for memory loss. This patient is accompanied in the office by her husband who supplements the history.  Previous records as well as any outside records available were reviewed prior to todays visit. Her husband reports that her memory may be slightly worse, but what he is concerned about is her safety.  The patient sustained a mechanical fall, without any fractures.  She seems to need more care, and she has now a  sitter during the day that comes 5 days a week during the week, and a few hours in the morning on the weekend.  He reports that she may be weaker, and she has no interest in ambulating as much as she used to.  She only does so for about 10 minutes.  Lexapro controls her mood well, and she seems to be overall happy.  She still continues to wake up during the night, and he wakes up with her to make sure that she goes to the bathroom without falling.  She needs assistance not only with bathing, but with dressing.  He is now managing the medications and places them in the pillbox.  He also manages the finances and the driving.  She continues to have mild hallucinations of dead family members, but there are no new hallucinations or frightening events.  She also confuses names between family members. Her appetite is about the same, denies trouble swallowing.  She does not cook.  At home, at times she uses a walker to ambulate.  No new head injuries.  She denies any new headaches, anosmia, double vision, dizziness, focal numbness or tingling, unilateral weakness.  She has known essential tremor,- mild head tremor and bilateral hand tremors.  She has urine incontinence, denies constipation or diarrhea.  Her speech is overall tangential, but she is alert.  In many opportunities she talks about the time that she was a Runner, broadcasting/film/video, and her students.  INITIAL HISTORY OF PRESENT ILLNESS 07/26/20 Dr. 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 Her husband of 27 years is present to provide additional information. When asked about her memory, she states that "if I am interested, I have not forgotten at all." Her husband started noticing memory changes 2-3 years ago. 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 She misplaces things frequently, putting things in the wrong places. She has a hard time staying focused on the conversation and with her activities. She is noted to trail off with sentences, slightly tangential. She manages her own medications and he states she seems to be organized with her pillbox, she is very methodical, he observes her but does not check/count pills. He helps her with the weekly alendronate. She stopped driving a year ago on her own accord, she was not getting lost but her husband noted she was driving very slowly. He manages finances. She states she fixes meals and he eats, he shakes his head and reports he manages meals. He reports personality changes, he likes to walk outside, she does not exercise, so when he comes back after an hour and a half, she is angry at him for being gone. They talk about it and she quiets down. She states her mood is fine. No paranoia or hallucinations. She fell down steps 2 days ago and has stitches on her forehead and bilateral periorbital hematomas. He reports that she refuses to use the antibiotic ointment given and rubs lotion. She is independent with dressing and bathing. She does not do any chores (ie laundry, dishes). No family history of dementia. No other head injuries in the past. No alcohol use.   She denies any headaches, dizziness, diplopia, dysarthria/dysphagia, neck/back pain, focal numbness/tingling/weakness, bowel/bladder dysfunction, anosmia. She has a history of essential tremor and has a head tremor and bilateral hand tremors. She states tremor does not affect using a cup/utensils. She reports sleep is good, her husband notes she wakes up 2 times to use the bathroom.   I personally reviewed head CT without contrast done 07/24/20 and MRI brain in 2017 which showed mild to moderate diffuse volume loss, minimal chronic microvascular disease, no acute changes.           PREVIOUS MEDICATIONS:   CURRENT MEDICATIONS:   Outpatient Encounter Medications as of 06/29/2021  Medication Sig   alendronate (FOSAMAX) 70 MG tablet Take 70 mg by mouth every 7 (seven) days. Take with a full glass of water on an empty stomach.   amLODipine (NORVASC) 5 MG tablet Take 5 mg by mouth daily.   atenolol (TENORMIN) 50 MG tablet Take 50 mg by mouth daily.    Cholecalciferol 2000 UNITS CAPS Take 2,000 Units by mouth daily.   escitalopram (LEXAPRO) 5 MG tablet Take 1 tablet (5 mg total) by mouth daily. (Patient taking differently: Take 10 mg by mouth daily.)   ezetimibe (ZETIA) 10 MG tablet Take 10 mg by mouth at bedtime.    triamcinolone cream (KENALOG) 0.1 % Apply topically 2 (two) times daily as needed.   No facility-administered encounter medications on file as of 06/29/2021.     Objective:     PHYSICAL EXAMINATION:    VITALS:   Vitals:   06/29/21 1430  BP: 133/62  Pulse: 63  SpO2: 97%  Weight: 130 lb 3.2 oz (59.1 kg)    GEN:  The patient appears stated age and is in NAD. HEENT:  Normocephalic, atraumatic.   Neurological examination:  General: NAD, well-groomed, appears stated age. Orientation: The patient is alert. Oriented to person, place and not to date, the year is 51.  Cranial nerves: There is good facial symmetry.The speech is tangential, repeating several times a story about her students.  No aphasia or dysarthria. Fund of knowledge is reduced. Recent and remote memory are impaired. Attention and concentration are reduced.  Unable to name objects.  Cannot repeat phrases.  Hearing is intact to conversational tone.    Sensation: Sensation is intact to light touch throughout Motor: Strength is at least antigravity x4. Tremors: none  DTR's 2/4 in UE/LE    No flowsheet data found. No flowsheet data found.  [employer] Mental Exam 07/26/2020  Weekday Correct 0  Current year 0  What state are we in? 1  Amount spent 0  Amount left 0  # of Animals 1  5 objects recall 0  Number series 0  Hour  markers 0  Time correct 0  Placed X in triangle correctly 1  Largest Figure 1  Name of female 0  Date back to work 0  Type of work 0  State she lived in 0  Total score 4       Movement examination: Tone: There is normal tone in the UE/LE Abnormal movements:  no tremor.  No myoclonus.  No asterixis.   Coordination:  There is no decremation with RAM's.  Patient unable to follow the command for finger to nose  Gait and Station: The patient has no difficulty arising out of a deep-seated chair without the use of the hands. The patient's stride length is good.  Gait is cautious and narrow.  Tremor: There is a head tremor, in right greater than left intention tremor, no resting tremor.  Finger taps normal.      Total time spent on today's visit was  40 minutes, including both face-to-face time and nonface-to-face time. Time included that spent on review of records (prior notes available to me/labs/imaging if pertinent), discussing treatment and goals, answering patient's questions and coordinating care.  Cc:  Tally Joe, MD Marlowe Kays, PA-C

## 2021-06-29 ENCOUNTER — Encounter: Payer: Self-pay | Admitting: Physician Assistant

## 2021-06-29 ENCOUNTER — Ambulatory Visit: Payer: Medicare PPO | Admitting: Physician Assistant

## 2021-06-29 ENCOUNTER — Other Ambulatory Visit: Payer: Self-pay

## 2021-06-29 VITALS — BP 133/62 | HR 63 | Wt 130.2 lb

## 2021-06-29 DIAGNOSIS — F0281 Dementia in other diseases classified elsewhere with behavioral disturbance: Secondary | ICD-10-CM

## 2021-06-29 DIAGNOSIS — F02818 Dementia in other diseases classified elsewhere, unspecified severity, with other behavioral disturbance: Secondary | ICD-10-CM

## 2021-06-29 DIAGNOSIS — G301 Alzheimer's disease with late onset: Secondary | ICD-10-CM

## 2021-06-29 NOTE — Patient Instructions (Addendum)
It was a pleasure to see you today at our office.   Recommendations:  Meds: Follow up in 6  months PLease call Carmon Sails Paladino  [phone]  [email or web address]   RECOMMENDATIONS FOR ALL PATIENTS WITH MEMORY PROBLEMS: 1. Continue to exercise (Recommend 30 minutes of walking everyday, or 3 hours every week) 2. Increase social interactions - continue going to [place] and enjoy social gatherings with friends and family 3. Eat healthy, avoid fried foods and eat more fruits and vegetables 4. Maintain adequate blood pressure, blood sugar, and blood cholesterol level. Reducing the risk of stroke and cardiovascular disease also helps promoting better memory. 5. Avoid stressful situations. Live a simple life and avoid aggravations. Organize your time and prepare for the next day in anticipation. 6. Sleep well, avoid any interruptions of sleep and avoid any distractions in the bedroom that may interfere with adequate sleep quality 7. Avoid sugar, avoid sweets as there is a strong link between excessive sugar intake, diabetes, and cognitive impairment We discussed the Mediterranean diet, which has been shown to help patients reduce the risk of progressive memory disorders and reduces cardiovascular risk. This includes eating fish, eat fruits and green leafy vegetables, nuts like almonds and hazelnuts, walnuts, and also use olive oil. Avoid fast foods and fried foods as much as possible. Avoid sweets and sugar as sugar use has been linked to worsening of memory function.  There is always a concern of gradual progression of memory problems. If this is the case, then we may need to adjust level of care according to patient needs. Support, both to the patient and caregiver, should then be put into place.    The Alzheimer's Association is here all day, every day for people facing Alzheimer's disease through our free 24/7 Helpline: [phone]. The Helpline provides reliable information  and support to all those who need assistance, such as individuals living with memory loss, Alzheimer's or other dementia, caregivers, health care professionals and the public.  Our highly trained and knowledgeable staff can help you with: Understanding memory loss, dementia and Alzheimer's  Medications and other treatment options  General information about aging and brain health  Skills to provide quality care and to find the best care from professionals  Legal, financial and living-arrangement decisions Our Helpline also features: Confidential care consultation provided by master's level clinicians who can help with decision-making support, crisis assistance and education on issues families face every day  Help in a caller's preferred language using our translation service that features more than 200 languages and dialects  Referrals to local community programs, services and ongoing support     FALL PRECAUTIONS: Be cautious when walking. Scan the area for obstacles that may increase the risk of trips and falls. When getting up in the mornings, sit up at the edge of the bed for a few minutes before getting out of bed. Consider elevating the bed at the head end to avoid drop of blood pressure when getting up. Walk always in a well-lit room (use night lights in the walls). Avoid area rugs or power cords from appliances in the middle of the walkways. Use a walker or a cane if necessary and consider physical therapy for balance exercise. Get your eyesight checked regularly.  FINANCIAL OVERSIGHT: Supervision, especially oversight when making financial decisions or transactions is also recommended.  HOME SAFETY: Consider the safety of the kitchen when operating appliances like stoves, microwave oven, and blender. Consider having supervision and share cooking responsibilities until no  longer able to participate in those. Accidents with firearms and other hazards in the house should be identified and  addressed as well.   ABILITY TO BE LEFT ALONE: If patient is unable to contact 911 operator, consider using LifeLine, or when the need is there, arrange for someone to stay with patients. Smoking is a fire hazard, consider supervision or cessation. Risk of wandering should be assessed by caregiver and if detected at any point, supervision and safe proof recommendations should be instituted.  MEDICATION SUPERVISION: Inability to self-administer medication needs to be constantly addressed. Implement a mechanism to ensure safe administration of the medications.   DRIVING: Regarding driving, in patients with progressive memory problems, driving will be impaired. We advise to have someone else do the driving if trouble finding directions or if minor accidents are reported. Independent driving assessment is available to determine safety of driving.   If you are interested in the driving assessment, you can contact the following:  [facility] [employer] [facility] in [place] [phone]  [facility] [phone]  [facility] [phone] or [phone]      Mediterranean Diet A Mediterranean diet refers to food and lifestyle choices that are based on the traditions of countries located on the [employer] [place]. This way of eating has been shown to help prevent certain conditions and improve outcomes for people who have chronic diseases, like kidney disease and heart disease. What are tips for following this plan? Lifestyle  Cook and eat meals together with your family, when possible. Drink enough fluid to keep your urine clear or pale yellow. Be physically active every day. This includes: Aerobic exercise like running or swimming. Leisure activities like gardening, walking, or housework. Get 7-8 hours of sleep each night. If recommended by your health care provider, drink red wine in moderation. This means 1 glass a day for  nonpregnant women and 2 glasses a day for men. A glass of wine equals 5 oz (150 mL). Reading food labels  Check the serving size of packaged foods. For foods such as rice and pasta, the serving size refers to the amount of cooked product, not dry. Check the total fat in packaged foods. Avoid foods that have saturated fat or trans fats. Check the ingredients list for added sugars, such as corn syrup. Shopping  At the grocery store, buy most of your food from the areas near the walls of the store. This includes: Fresh fruits and vegetables (produce). Grains, beans, nuts, and seeds. Some of these may be available in unpackaged forms or large amounts (in bulk). Fresh seafood. Poultry and eggs. Low-fat dairy products. Buy whole ingredients instead of prepackaged foods. Buy fresh fruits and vegetables in-season from local farmers markets. Buy frozen fruits and vegetables in resealable bags. If you do not have access to quality fresh seafood, buy precooked frozen shrimp or canned fish, such as tuna, salmon, or sardines. Buy small amounts of raw or cooked vegetables, salads, or olives from the deli or salad bar at your store. Stock your pantry so you always have certain foods on hand, such as olive oil, canned tuna, canned tomatoes, rice, pasta, and beans. Cooking  Cook foods with extra-virgin olive oil instead of using butter or other vegetable oils. Have meat as a side dish, and have vegetables or grains as your main dish. This means having meat in small portions or adding small amounts of meat to foods like pasta or stew. Use beans or vegetables instead of meat  in common dishes like chili or lasagna. Experiment with different cooking methods. Try roasting or broiling vegetables instead of steaming or sauteing them. Add frozen vegetables to soups, stews, pasta, or rice. Add nuts or seeds for added healthy fat at each meal. You can add these to yogurt, salads, or vegetable dishes. Marinate fish or  vegetables using olive oil, lemon juice, garlic, and fresh herbs. Meal planning  Plan to eat 1 vegetarian meal one day each week. Try to work up to 2 vegetarian meals, if possible. Eat seafood 2 or more times a week. Have healthy snacks readily available, such as: Vegetable sticks with hummus. Greek yogurt. Fruit and nut trail mix. Eat balanced meals throughout the week. This includes: Fruit: 2-3 servings a day Vegetables: 4-5 servings a day Low-fat dairy: 2 servings a day Fish, poultry, or lean meat: 1 serving a day Beans and legumes: 2 or more servings a week Nuts and seeds: 1-2 servings a day Whole grains: 6-8 servings a day Extra-virgin olive oil: 3-4 servings a day Limit red meat and sweets to only a few servings a month What are my food choices? Mediterranean diet Recommended Grains: Whole-grain pasta. Brown rice. Bulgar wheat. Polenta. Couscous. Whole-wheat bread. Orpah Cobb. Vegetables: Artichokes. Beets. Broccoli. Cabbage. Carrots. Eggplant. Green beans. Chard. Kale. Spinach. Onions. Leeks. Peas. Squash. Tomatoes. Peppers. Radishes. Fruits: Apples. Apricots. Avocado. Berries. Bananas. Cherries. Dates. Figs. Grapes. Lemons. Melon. Oranges. Peaches. Plums. Pomegranate. Meats and other protein foods: Beans. Almonds. Sunflower seeds. Pine nuts. Peanuts. Cod. Salmon. Scallops. Shrimp. Tuna. Tilapia. Clams. Oysters. Eggs. Dairy: Low-fat milk. Cheese. Greek yogurt. Beverages: Water. Red wine. Herbal tea. Fats and oils: Extra virgin olive oil. Avocado oil. Grape seed oil. Sweets and desserts: [place] yogurt with honey. Baked apples. Poached pears. Trail mix. Seasoning and other foods: Basil. Cilantro. Coriander. Cumin. Mint. Parsley. Sage. Rosemary. Tarragon. Garlic. Oregano. Thyme. Pepper. Balsalmic vinegar. Tahini. Hummus. Tomato sauce. Olives. Mushrooms. Limit these Grains: Prepackaged pasta or rice dishes. Prepackaged cereal with added sugar. Vegetables: Deep fried potatoes  (french fries). Fruits: Fruit canned in syrup. Meats and other protein foods: Beef. Pork. Lamb. Poultry with skin. Hot dogs. Tomasa Blase. Dairy: Ice cream. Sour cream. Whole milk. Beverages: Juice. Sugar-sweetened soft drinks. Beer. Liquor and spirits. Fats and oils: Butter. Canola oil. Vegetable oil. Beef fat (tallow). Lard. Sweets and desserts: Cookies. Cakes. Pies. Candy. Seasoning and other foods: Mayonnaise. Premade sauces and marinades. The items listed may not be a complete list. Talk with your dietitian about what dietary choices are right for you. Summary The Mediterranean diet includes both food and lifestyle choices. Eat a variety of fresh fruits and vegetables, beans, nuts, seeds, and whole grains. Limit the amount of red meat and sweets that you eat. Talk with your health care provider about whether it is safe for you to drink red wine in moderation. This means 1 glass a day for nonpregnant women and 2 glasses a day for men. A glass of wine equals 5 oz (150 mL). This information is not intended to replace advice given to you by your health care provider. Make sure you discuss any questions you have with your health care provider. Document Released: 06/20/2016 Document Revised: 07/23/2016 Document Reviewed: 06/20/2016 Elsevier Interactive Patient Education  2017 [employer].

## 2021-07-11 DIAGNOSIS — A499 Bacterial infection, unspecified: Secondary | ICD-10-CM | POA: Diagnosis not present

## 2021-07-11 DIAGNOSIS — R410 Disorientation, unspecified: Secondary | ICD-10-CM | POA: Diagnosis not present

## 2021-07-11 DIAGNOSIS — R1110 Vomiting, unspecified: Secondary | ICD-10-CM | POA: Diagnosis not present

## 2021-07-11 DIAGNOSIS — R350 Frequency of micturition: Secondary | ICD-10-CM | POA: Diagnosis not present

## 2021-07-11 DIAGNOSIS — Z8659 Personal history of other mental and behavioral disorders: Secondary | ICD-10-CM | POA: Diagnosis not present

## 2021-07-13 ENCOUNTER — Encounter: Payer: Self-pay | Admitting: Podiatry

## 2021-07-13 ENCOUNTER — Ambulatory Visit: Payer: Medicare PPO | Admitting: Podiatry

## 2021-07-13 ENCOUNTER — Other Ambulatory Visit: Payer: Self-pay

## 2021-07-13 DIAGNOSIS — M79674 Pain in right toe(s): Secondary | ICD-10-CM

## 2021-07-13 DIAGNOSIS — M79675 Pain in left toe(s): Secondary | ICD-10-CM

## 2021-07-13 DIAGNOSIS — R350 Frequency of micturition: Secondary | ICD-10-CM | POA: Diagnosis not present

## 2021-07-13 DIAGNOSIS — B351 Tinea unguium: Secondary | ICD-10-CM | POA: Diagnosis not present

## 2021-07-13 DIAGNOSIS — R7989 Other specified abnormal findings of blood chemistry: Secondary | ICD-10-CM | POA: Diagnosis not present

## 2021-07-13 NOTE — Progress Notes (Signed)
This patient presents to the office with chief complaint of long thick painful nails.  Patient says the nails are painful walking and wearing shoes.  This patient is unable to self treat.  This patient is unable to trim her nails since she is unable to reach her nails. She presents to the office with her husband. She presents to the office for preventative foot care services.  General Appearance  Alert, conversant and in no acute stress.  Vascular  Dorsalis pedis and posterior tibial  pulses are  weakly palpable  bilaterally.  Capillary return is within normal limits  bilaterally. Cold feet bilaterally. Absent digital hair.  Neurologic  Senn-Weinstein monofilament wire test within normal limits  bilaterally. Muscle power within normal limits bilaterally.  Nails Thick disfigured discolored nails with subungual debris  from hallux to fifth toes bilaterally. No evidence of bacterial infection or drainage bilaterally.  Orthopedic  No limitations of motion  feet .  No crepitus or effusions noted.  No bony pathology or digital deformities noted.  Skin  normotropic skin with no porokeratosis noted bilaterally.  No signs of infections or ulcers noted.     Onychomycosis  Nails  B/L.  Pain in right toes  Pain in left toes  Debridement of nails both feet followed trimming the nails with dremel tool.    RTC 3 months.   Helane Gunther DPM

## 2021-08-03 DIAGNOSIS — Z0000 Encounter for general adult medical examination without abnormal findings: Secondary | ICD-10-CM | POA: Diagnosis not present

## 2021-08-03 DIAGNOSIS — E7800 Pure hypercholesterolemia, unspecified: Secondary | ICD-10-CM | POA: Diagnosis not present

## 2021-08-03 DIAGNOSIS — I10 Essential (primary) hypertension: Secondary | ICD-10-CM | POA: Diagnosis not present

## 2021-08-03 DIAGNOSIS — F0281 Dementia in other diseases classified elsewhere with behavioral disturbance: Secondary | ICD-10-CM | POA: Diagnosis not present

## 2021-08-03 DIAGNOSIS — M810 Age-related osteoporosis without current pathological fracture: Secondary | ICD-10-CM | POA: Diagnosis not present

## 2021-08-03 DIAGNOSIS — G301 Alzheimer's disease with late onset: Secondary | ICD-10-CM | POA: Diagnosis not present

## 2021-08-03 DIAGNOSIS — Z1389 Encounter for screening for other disorder: Secondary | ICD-10-CM | POA: Diagnosis not present

## 2021-08-03 DIAGNOSIS — E1169 Type 2 diabetes mellitus with other specified complication: Secondary | ICD-10-CM | POA: Diagnosis not present

## 2021-08-03 DIAGNOSIS — E559 Vitamin D deficiency, unspecified: Secondary | ICD-10-CM | POA: Diagnosis not present

## 2021-08-03 DIAGNOSIS — Z23 Encounter for immunization: Secondary | ICD-10-CM | POA: Diagnosis not present

## 2021-08-17 DIAGNOSIS — D485 Neoplasm of uncertain behavior of skin: Secondary | ICD-10-CM | POA: Diagnosis not present

## 2021-08-17 DIAGNOSIS — I872 Venous insufficiency (chronic) (peripheral): Secondary | ICD-10-CM | POA: Diagnosis not present

## 2021-08-17 DIAGNOSIS — C44729 Squamous cell carcinoma of skin of left lower limb, including hip: Secondary | ICD-10-CM | POA: Diagnosis not present

## 2021-08-17 DIAGNOSIS — S80812A Abrasion, left lower leg, initial encounter: Secondary | ICD-10-CM | POA: Diagnosis not present

## 2021-08-22 DIAGNOSIS — N958 Other specified menopausal and perimenopausal disorders: Secondary | ICD-10-CM | POA: Diagnosis not present

## 2021-08-22 DIAGNOSIS — Z6824 Body mass index (BMI) 24.0-24.9, adult: Secondary | ICD-10-CM | POA: Diagnosis not present

## 2021-08-22 DIAGNOSIS — Z1231 Encounter for screening mammogram for malignant neoplasm of breast: Secondary | ICD-10-CM | POA: Diagnosis not present

## 2021-08-22 DIAGNOSIS — Z7983 Long term (current) use of bisphosphonates: Secondary | ICD-10-CM | POA: Diagnosis not present

## 2021-08-22 DIAGNOSIS — R29890 Loss of height: Secondary | ICD-10-CM | POA: Diagnosis not present

## 2021-08-22 DIAGNOSIS — M816 Localized osteoporosis [Lequesne]: Secondary | ICD-10-CM | POA: Diagnosis not present

## 2021-08-22 DIAGNOSIS — Z01419 Encounter for gynecological examination (general) (routine) without abnormal findings: Secondary | ICD-10-CM | POA: Diagnosis not present

## 2021-10-17 ENCOUNTER — Ambulatory Visit: Payer: Medicare PPO | Admitting: Podiatry

## 2021-10-19 ENCOUNTER — Other Ambulatory Visit: Payer: Self-pay

## 2021-10-19 ENCOUNTER — Encounter (HOSPITAL_BASED_OUTPATIENT_CLINIC_OR_DEPARTMENT_OTHER): Payer: Self-pay | Admitting: *Deleted

## 2021-10-19 ENCOUNTER — Emergency Department (HOSPITAL_BASED_OUTPATIENT_CLINIC_OR_DEPARTMENT_OTHER): Payer: Medicare PPO

## 2021-10-19 ENCOUNTER — Emergency Department (HOSPITAL_BASED_OUTPATIENT_CLINIC_OR_DEPARTMENT_OTHER)
Admission: EM | Admit: 2021-10-19 | Discharge: 2021-10-19 | Disposition: A | Discharge status: Home / Self Care | Payer: Medicare PPO | Attending: Emergency Medicine | Admitting: Emergency Medicine

## 2021-10-19 DIAGNOSIS — S3992XA Unspecified injury of lower back, initial encounter: Secondary | ICD-10-CM | POA: Diagnosis present

## 2021-10-19 DIAGNOSIS — S80811A Abrasion, right lower leg, initial encounter: Secondary | ICD-10-CM | POA: Insufficient documentation

## 2021-10-19 DIAGNOSIS — F0390 Unspecified dementia without behavioral disturbance: Secondary | ICD-10-CM | POA: Insufficient documentation

## 2021-10-19 DIAGNOSIS — M545 Low back pain, unspecified: Secondary | ICD-10-CM | POA: Diagnosis not present

## 2021-10-19 DIAGNOSIS — W19XXXA Unspecified fall, initial encounter: Secondary | ICD-10-CM | POA: Diagnosis not present

## 2021-10-19 DIAGNOSIS — I10 Essential (primary) hypertension: Secondary | ICD-10-CM | POA: Insufficient documentation

## 2021-10-19 DIAGNOSIS — S32040A Wedge compression fracture of fourth lumbar vertebra, initial encounter for closed fracture: Secondary | ICD-10-CM | POA: Insufficient documentation

## 2021-10-19 DIAGNOSIS — Y92009 Unspecified place in unspecified non-institutional (private) residence as the place of occurrence of the external cause: Secondary | ICD-10-CM | POA: Diagnosis not present

## 2021-10-19 DIAGNOSIS — M2578 Osteophyte, vertebrae: Secondary | ICD-10-CM | POA: Diagnosis not present

## 2021-10-19 DIAGNOSIS — S0990XA Unspecified injury of head, initial encounter: Secondary | ICD-10-CM | POA: Diagnosis not present

## 2021-10-19 DIAGNOSIS — R102 Pelvic and perineal pain: Secondary | ICD-10-CM | POA: Diagnosis not present

## 2021-10-19 DIAGNOSIS — M8588 Other specified disorders of bone density and structure, other site: Secondary | ICD-10-CM | POA: Diagnosis not present

## 2021-10-19 DIAGNOSIS — R350 Frequency of micturition: Secondary | ICD-10-CM | POA: Diagnosis not present

## 2021-10-19 DIAGNOSIS — R109 Unspecified abdominal pain: Secondary | ICD-10-CM | POA: Diagnosis not present

## 2021-10-19 DIAGNOSIS — Z79899 Other long term (current) drug therapy: Secondary | ICD-10-CM | POA: Insufficient documentation

## 2021-10-19 LAB — CBC WITH DIFFERENTIAL/PLATELET
Abs Immature Granulocytes: 0.03 10*3/uL (ref 0.00–0.07)
Basophils Absolute: 0 10*3/uL (ref 0.0–0.1)
Basophils Relative: 0 %
Eosinophils Absolute: 0 10*3/uL (ref 0.0–0.5)
Eosinophils Relative: 0 %
HCT: 40 % (ref 36.0–46.0)
Hemoglobin: 13.6 g/dL (ref 12.0–15.0)
Immature Granulocytes: 0 %
Lymphocytes Relative: 4 %
Lymphs Abs: 0.5 10*3/uL — ABNORMAL LOW (ref 0.7–4.0)
MCH: 29.7 pg (ref 26.0–34.0)
MCHC: 34 g/dL (ref 30.0–36.0)
MCV: 87.3 fL (ref 80.0–100.0)
Monocytes Absolute: 0.3 10*3/uL (ref 0.1–1.0)
Monocytes Relative: 2 %
Neutro Abs: 10.8 10*3/uL — ABNORMAL HIGH (ref 1.7–7.7)
Neutrophils Relative %: 94 %
Platelets: 199 10*3/uL (ref 150–400)
RBC: 4.58 MIL/uL (ref 3.87–5.11)
RDW: 13.2 % (ref 11.5–15.5)
WBC: 11.6 10*3/uL — ABNORMAL HIGH (ref 4.0–10.5)
nRBC: 0 % (ref 0.0–0.2)

## 2021-10-19 LAB — URINALYSIS, ROUTINE W REFLEX MICROSCOPIC
Bilirubin Urine: NEGATIVE
Glucose, UA: NEGATIVE mg/dL
Hgb urine dipstick: NEGATIVE
Ketones, ur: NEGATIVE mg/dL
Leukocytes,Ua: NEGATIVE
Nitrite: NEGATIVE
Protein, ur: 30 mg/dL — AB
Specific Gravity, Urine: 1.02 (ref 1.005–1.030)
pH: 7.5 (ref 5.0–8.0)

## 2021-10-19 LAB — URINALYSIS, MICROSCOPIC (REFLEX): WBC, UA: NONE SEEN WBC/hpf (ref 0–5)

## 2021-10-19 MED ORDER — ONDANSETRON HCL 4 MG/2ML IJ SOLN
4.0000 mg | Freq: Once | INTRAMUSCULAR | Status: DC
  Filled 2021-10-19: qty 2

## 2021-10-19 NOTE — ED Notes (Signed)
Multiple unsuccessful IV attempts. Pt's family member requests that an IV only be attempted again if absolutely necessary. MD made aware.

## 2021-10-19 NOTE — Discharge Instructions (Addendum)
Follow-up with your primary care doctor as well as the spine doctor next week.  Come back to ER if she develops any recurrent vomiting, abdominal pain, uncontrolled back pain or other new concerning symptom.

## 2021-10-19 NOTE — ED Triage Notes (Signed)
Unwitnessed fall at home. Her husband found her on the floor by her bed. He had assistance getting her up. C.o pain in her back and left arm. She is confused per normal.

## 2021-10-19 NOTE — ED Provider Notes (Signed)
[facility] EMERGENCY DEPARTMENT Provider Note   CSN: [id number] Arrival date & time: 10/19/21  1641     History Chief Complaint  Patient presents with   Kimberly Byrd is a 87 y.o. female.  Presents to the emergency room with concern for fall.  Unwitnessed.  Husband was in a room nearby and heard the fall and went immediately to the bedside.  Seem to have rolled off the bed onto the ground.  Noted slight abrasion/cut to her right lower leg but did not see any other obvious trauma.  Patient was able to walk and bear weight after the incident.  Seem to be doing okay however this afternoon had an episode of vomiting.  Nonbloody nonbilious.  [facility] to [facility], they recommend going to ER for more complete evaluation, possibly imaging.  Report that a urinalysis was completed at the [facility] clinic and they were told she did not have a urinary tract infection.  Patient has history of dementia which limits history, level 5 caveat.  History provided by daughter and husband at bedside.  Additional history obtained from chart review.   HPI     Past Medical History:  Diagnosis Date   Cataract    Hypertension     Patient Active Problem List   Diagnosis Date Noted   Late onset Alzheimer's disease with behavioral disturbance (HCC) 06/29/2021    Past Surgical History:  Procedure Laterality Date   EYE SURGERY     VAGINAL HYSTERECTOMY  1991     OB History   No obstetric history on file.     Family History  Problem Relation Age of Onset   Breast cancer Mother    Heart disease Brother     Social History   Tobacco Use   Smoking status: Never   Smokeless tobacco: Never  Vaping Use   Vaping Use: Never used  Substance Use Topics   Alcohol use: No   Drug use: No    Home Medications Prior to Admission medications   Medication Sig Start Date End Date Taking? Authorizing Provider  amLODipine (NORVASC) 5 MG tablet Take 5 mg by mouth daily. 06/19/15   Yes [provider]  atenolol (TENORMIN) 50 MG tablet Take 50 mg by mouth daily.    Yes [provider]  Cholecalciferol 2000 UNITS CAPS Take 2,000 Units by mouth daily.   Yes [provider]  escitalopram (LEXAPRO) 5 MG tablet Take 1 tablet (5 mg total) by mouth daily. Patient taking differently: Take 10 mg by mouth daily. 07/26/20  Yes Van Clines, MD  ezetimibe (ZETIA) 10 MG tablet Take 10 mg by mouth at bedtime.    Yes [provider]  triamcinolone cream (KENALOG) 0.1 % Apply topically 2 (two) times daily as needed. 02/27/21  Yes [provider]  alendronate (FOSAMAX) 70 MG tablet Take 70 mg by mouth every 7 (seven) days. Take with a full glass of water on an empty stomach.    [provider]    Allergies    Levofloxacin, Penicillins, Sulfa antibiotics, Denosumab, Epinephrine, and Lipitor [atorvastatin calcium]  Review of Systems   Review of Systems  Unable to perform ROS: Dementia   Physical Exam Updated Vital Signs BP (!) 128/55   Pulse (!) 59   Temp 98.6 F (37 C) (Oral)   Resp 15   Ht 5\' 2"  (1.575 m)   Wt 59 kg   SpO2 93%   BMI 23.78  kg/m   Physical Exam Vitals and nursing note reviewed.  Constitutional:      General: She is not in acute distress.    Appearance: She is well-developed.  HENT:     Head: Normocephalic and atraumatic.  Eyes:     Conjunctiva/sclera: Conjunctivae normal.  Cardiovascular:     Rate and Rhythm: Normal rate and regular rhythm.     Heart sounds: No murmur heard. Pulmonary:     Effort: Pulmonary effort is normal. No respiratory distress.     Breath sounds: Normal breath sounds.  Abdominal:     Palpations: Abdomen is soft.     Tenderness: There is no abdominal tenderness.  Musculoskeletal:        General: No swelling.     Cervical back: Neck supple.     Comments: Back: no C, T TTP, no step off or deformity, there is some tenderness to the lower L-spine but no step-off or  deformity RUE: no TTP throughout, no deformity, normal joint ROM, radial pulse intact, distal sensation and motor intact LUE: no TTP throughout, no deformity, normal joint ROM, radial pulse intact, distal sensation and motor intact RLE: Superficial abrasion, 1 cm, over the anterior lower leg, no TTP throughout, no deformity, normal joint ROM, distal pulse, sensation and motor intact LLE: no TTP throughout, no deformity, normal joint ROM, distal pulse, sensation and motor intact  Skin:    General: Skin is warm and dry.     Capillary Refill: Capillary refill takes less than 2 seconds.  Neurological:     Mental Status: She is alert.     Comments: Alert, oriented to person but not place or time  Psychiatric:        Mood and Affect: Mood normal.    ED Results / Procedures / Treatments   Labs (all labs ordered are listed, but only abnormal results are displayed) Labs Reviewed  CBC WITH DIFFERENTIAL/PLATELET - Abnormal; Notable for the following components:      Result Value   WBC 11.6 (*)    Neutro Abs 10.8 (*)    Lymphs Abs 0.5 (*)    All other components within normal limits  URINALYSIS, ROUTINE W REFLEX MICROSCOPIC - Abnormal; Notable for the following components:   Protein, ur 30 (*)    All other components within normal limits  URINALYSIS, MICROSCOPIC (REFLEX) - Abnormal; Notable for the following components:   Bacteria, UA RARE (*)    All other components within normal limits  COMPREHENSIVE METABOLIC PANEL    EKG EKG Interpretation  Date/Time:  Friday October 19 2021 18:23:11 EST Ventricular Rate:  66 PR Interval:  206 QRS Duration: 93 QT Interval:  428 QTC Calculation: 449 R Axis:   23 Text Interpretation: Sinus rhythm LVH by voltage Confirmed by Marianna Fuss ([id number]) on 10/19/2021 7:50:04 PM  Radiology DG Chest 1 View  Result Date: 10/19/2021 CLINICAL DATA:  Fall, low back pain EXAM: CHEST  1 VIEW COMPARISON:  None. FINDINGS: Lungs are clear.  No pleural effusion  or pneumothorax. The heart is normal in size. Thoracic aortic atherosclerosis. IMPRESSION: No evidence of acute cardiopulmonary disease. Electronically Signed   By: Charline Bills M.D.   On: 10/19/2021 19:06   DG Lumbar Spine Complete  Result Date: 10/19/2021 CLINICAL DATA:  Fall, low back pain EXAM: LUMBAR SPINE - COMPLETE 4+ VIEW COMPARISON:  None. FINDINGS: There is moderate compression fracture through the superior endplate of L4. Diffuse osteopenia. Normal alignment. Diffuse degenerative disc and facet disease. Aortic atherosclerosis.  IMPRESSION: Moderate compression fracture through the superior endplate of L4, age indeterminate. Aortic atherosclerosis. Electronically Signed   By: Charlett Nose M.D.   On: 10/19/2021 19:08   DG Pelvis 1-2 Views  Result Date: 10/19/2021 CLINICAL DATA:  Fall, low back pain EXAM: PELVIS - 1-2 VIEW COMPARISON:  None. FINDINGS: No fracture or dislocation is seen. The joint spaces are preserved. Visualized bony pelvis appears intact. Mild degenerative changes of the lower lumbar spine. IMPRESSION: Negative. Electronically Signed   By: Charline Bills M.D.   On: 10/19/2021 19:05   CT Head Wo Contrast  Result Date: 10/19/2021 CLINICAL DATA:  Trauma. EXAM: CT HEAD WITHOUT CONTRAST CT CERVICAL SPINE WITHOUT CONTRAST TECHNIQUE: Multidetector CT imaging of the head and cervical spine was performed following the standard protocol without intravenous contrast. Multiplanar CT image reconstructions of the cervical spine were also generated. COMPARISON:  CT head 07/24/2020 FINDINGS: CT HEAD FINDINGS Brain: No evidence of acute infarction, hemorrhage, hydrocephalus, extra-axial collection or mass lesion/mass effect. There is mild diffuse atrophy. Vascular: Atherosclerotic calcifications are present within the cavernous internal carotid arteries. Skull: Normal. Negative for fracture or focal lesion. Sinuses/Orbits: No acute finding. Other: None. CT CERVICAL SPINE FINDINGS  Alignment: There is 2 mm of anterolisthesis at C3-C4 and C4-C5 which is favored is degenerative. Alignment is otherwise anatomic. Skull base and vertebrae: No acute fracture. No primary bone lesion or focal pathologic process. Soft tissues and spinal canal: No prevertebral fluid or swelling. No visible canal hematoma. Disc levels: There is mild disc space narrowing throughout the cervical spine. Endplate osteophytes are seen at C5-C6 and C6-C7. There is some mild right-sided neural foraminal stenosis bilaterally at C4-C5. There is no severe central canal stenosis at any level. Upper chest: Negative. Other: None. IMPRESSION: 1.  No acute intracranial process. 2. No acute fracture or traumatic subluxation of the cervical spine. 3.  Multilevel degenerative changes of the cervical spine. Electronically Signed   By: Darliss Cheney M.D.   On: 10/19/2021 19:16   CT Cervical Spine Wo Contrast  Result Date: 10/19/2021 CLINICAL DATA:  Trauma. EXAM: CT HEAD WITHOUT CONTRAST CT CERVICAL SPINE WITHOUT CONTRAST TECHNIQUE: Multidetector CT imaging of the head and cervical spine was performed following the standard protocol without intravenous contrast. Multiplanar CT image reconstructions of the cervical spine were also generated. COMPARISON:  CT head 07/24/2020 FINDINGS: CT HEAD FINDINGS Brain: No evidence of acute infarction, hemorrhage, hydrocephalus, extra-axial collection or mass lesion/mass effect. There is mild diffuse atrophy. Vascular: Atherosclerotic calcifications are present within the cavernous internal carotid arteries. Skull: Normal. Negative for fracture or focal lesion. Sinuses/Orbits: No acute finding. Other: None. CT CERVICAL SPINE FINDINGS Alignment: There is 2 mm of anterolisthesis at C3-C4 and C4-C5 which is favored is degenerative. Alignment is otherwise anatomic. Skull base and vertebrae: No acute fracture. No primary bone lesion or focal pathologic process. Soft tissues and spinal canal: No prevertebral  fluid or swelling. No visible canal hematoma. Disc levels: There is mild disc space narrowing throughout the cervical spine. Endplate osteophytes are seen at C5-C6 and C6-C7. There is some mild right-sided neural foraminal stenosis bilaterally at C4-C5. There is no severe central canal stenosis at any level. Upper chest: Negative. Other: None. IMPRESSION: 1.  No acute intracranial process. 2. No acute fracture or traumatic subluxation of the cervical spine. 3.  Multilevel degenerative changes of the cervical spine. Electronically Signed   By: Darliss Cheney M.D.   On: 10/19/2021 19:16    Procedures Procedures   Medications Ordered  in ED Medications  ondansetron (ZOFRAN) injection 4 mg (0 mg Intravenous Hold 10/19/21 1837)    ED Course  I have reviewed the triage vital signs and the nursing notes.  Pertinent labs & imaging results that were available during my care of the patient were reviewed by me and considered in my medical decision making (see chart for details).    MDM Rules/Calculators/A&P                          87 year old lady presents to ER after having an unwitnessed fall this morning and having an episode of vomiting this afternoon.  On physical exam she appears well in no distress.  Checked CT of head given report of possible head trauma and vomiting though there was no obvious trauma on exam to the head.  CT head, C-spine negative.  Plain films of L-spine concerning for moderate compression fracture of L4 endplate, age indeterminant.  Had ordered basic labs however CMP was hemolyzed.  Abdomen soft and nontender.  Patient was difficult IV stick and family requested no additional IV sticks if possible.  Given she has no ongoing gastrointestinal complaint, tolerating p.o. without difficulty appears well-hydrated, do not feel she needs CMP today and will defer for now.  Given reassuring work-up and reassuring clinical appearance, no ongoing symptoms, believe appropriate for discharge and  outpatient management.  Recommend following up with primary doctor this coming week.  Additionally provided neurosurgery follow-up info.  After the discussed management above, the patient was determined to be safe for discharge.  The patient and family was in agreement with this plan and all questions regarding their care were answered.  ED return precautions were discussed and the patient will return to the ED with any significant worsening of condition.   Final Clinical Impression(s) / ED Diagnoses Final diagnoses:  Fall, initial encounter  Compression fracture of L4 vertebra, initial encounter [facility])    Rx / DC Orders ED Discharge Orders     None        Milagros Loll, MD 10/19/21 2016

## 2021-10-22 ENCOUNTER — Ambulatory Visit: Payer: Medicare PPO | Admitting: Podiatry

## 2021-10-23 ENCOUNTER — Emergency Department (HOSPITAL_COMMUNITY)
Admission: EM | Admit: 2021-10-23 | Discharge: 2021-10-24 | Disposition: A | Discharge status: Home / Self Care | Payer: Medicare PPO | Attending: Emergency Medicine | Admitting: Emergency Medicine

## 2021-10-23 ENCOUNTER — Emergency Department (HOSPITAL_COMMUNITY): Payer: Medicare PPO

## 2021-10-23 ENCOUNTER — Encounter (HOSPITAL_COMMUNITY): Payer: Self-pay

## 2021-10-23 DIAGNOSIS — Z20822 Contact with and (suspected) exposure to covid-19: Secondary | ICD-10-CM | POA: Diagnosis not present

## 2021-10-23 DIAGNOSIS — W19XXXA Unspecified fall, initial encounter: Secondary | ICD-10-CM | POA: Insufficient documentation

## 2021-10-23 DIAGNOSIS — E860 Dehydration: Secondary | ICD-10-CM | POA: Diagnosis not present

## 2021-10-23 DIAGNOSIS — Z043 Encounter for examination and observation following other accident: Secondary | ICD-10-CM | POA: Diagnosis not present

## 2021-10-23 DIAGNOSIS — S0990XA Unspecified injury of head, initial encounter: Secondary | ICD-10-CM | POA: Diagnosis present

## 2021-10-23 DIAGNOSIS — Z79899 Other long term (current) drug therapy: Secondary | ICD-10-CM | POA: Insufficient documentation

## 2021-10-23 DIAGNOSIS — I959 Hypotension, unspecified: Secondary | ICD-10-CM | POA: Diagnosis not present

## 2021-10-23 DIAGNOSIS — S0083XA Contusion of other part of head, initial encounter: Secondary | ICD-10-CM | POA: Insufficient documentation

## 2021-10-23 DIAGNOSIS — D72829 Elevated white blood cell count, unspecified: Secondary | ICD-10-CM | POA: Diagnosis not present

## 2021-10-23 DIAGNOSIS — R531 Weakness: Secondary | ICD-10-CM

## 2021-10-23 DIAGNOSIS — F0280 Dementia in other diseases classified elsewhere without behavioral disturbance: Secondary | ICD-10-CM | POA: Insufficient documentation

## 2021-10-23 DIAGNOSIS — G309 Alzheimer's disease, unspecified: Secondary | ICD-10-CM | POA: Insufficient documentation

## 2021-10-23 DIAGNOSIS — I10 Essential (primary) hypertension: Secondary | ICD-10-CM | POA: Insufficient documentation

## 2021-10-23 DIAGNOSIS — M47812 Spondylosis without myelopathy or radiculopathy, cervical region: Secondary | ICD-10-CM | POA: Diagnosis not present

## 2021-10-23 DIAGNOSIS — E871 Hypo-osmolality and hyponatremia: Secondary | ICD-10-CM | POA: Insufficient documentation

## 2021-10-23 DIAGNOSIS — R410 Disorientation, unspecified: Secondary | ICD-10-CM | POA: Diagnosis not present

## 2021-10-23 LAB — COMPREHENSIVE METABOLIC PANEL
ALT: 23 U/L (ref 0–44)
AST: 32 U/L (ref 15–41)
Albumin: 4.5 g/dL (ref 3.5–5.0)
Alkaline Phosphatase: 73 U/L (ref 38–126)
Anion gap: 10 (ref 5–15)
BUN: 27 mg/dL — ABNORMAL HIGH (ref 8–23)
CO2: 24 mmol/L (ref 22–32)
Calcium: 9.6 mg/dL (ref 8.9–10.3)
Chloride: 94 mmol/L — ABNORMAL LOW (ref 98–111)
Creatinine, Ser: 0.81 mg/dL (ref 0.44–1.00)
GFR, Estimated: >60 mL/min (ref >60)
Glucose, Bld: 131 mg/dL — ABNORMAL HIGH (ref 70–99)
Potassium: 3.8 mmol/L (ref 3.5–5.1)
Sodium: 128 mmol/L — ABNORMAL LOW (ref 135–145)
Total Bilirubin: 1 mg/dL (ref 0.3–1.2)
Total Protein: 8.2 g/dL — ABNORMAL HIGH (ref 6.5–8.1)

## 2021-10-23 LAB — CBC WITH DIFFERENTIAL/PLATELET
Abs Immature Granulocytes: 0.06 10*3/uL (ref 0.00–0.07)
Basophils Absolute: 0 10*3/uL (ref 0.0–0.1)
Basophils Relative: 0 %
Eosinophils Absolute: 0 10*3/uL (ref 0.0–0.5)
Eosinophils Relative: 0 %
HCT: 41.8 % (ref 36.0–46.0)
Hemoglobin: 14.2 g/dL (ref 12.0–15.0)
Immature Granulocytes: 1 %
Lymphocytes Relative: 17 %
Lymphs Abs: 2.2 10*3/uL (ref 0.7–4.0)
MCH: 29.6 pg (ref 26.0–34.0)
MCHC: 34 g/dL (ref 30.0–36.0)
MCV: 87.1 fL (ref 80.0–100.0)
Monocytes Absolute: 1.4 10*3/uL — ABNORMAL HIGH (ref 0.1–1.0)
Monocytes Relative: 11 %
Neutro Abs: 9.1 10*3/uL — ABNORMAL HIGH (ref 1.7–7.7)
Neutrophils Relative %: 71 %
Platelets: 266 10*3/uL (ref 150–400)
RBC: 4.8 MIL/uL (ref 3.87–5.11)
RDW: 12.8 % (ref 11.5–15.5)
WBC: 12.8 10*3/uL — ABNORMAL HIGH (ref 4.0–10.5)
nRBC: 0 % (ref 0.0–0.2)

## 2021-10-23 LAB — URINALYSIS, ROUTINE W REFLEX MICROSCOPIC
Bacteria, UA: NONE SEEN
Bilirubin Urine: NEGATIVE
Glucose, UA: NEGATIVE mg/dL
Ketones, ur: 5 mg/dL — AB
Leukocytes,Ua: NEGATIVE
Nitrite: NEGATIVE
Protein, ur: 30 mg/dL — AB
Specific Gravity, Urine: 1.008 (ref 1.005–1.030)
pH: 7 (ref 5.0–8.0)

## 2021-10-23 LAB — RESP PANEL BY RT-PCR (FLU A&B, COVID) ARPGX2
Influenza A by PCR: NEGATIVE
Influenza B by PCR: NEGATIVE
SARS Coronavirus 2 by RT PCR: NEGATIVE

## 2021-10-23 MED ORDER — SODIUM CHLORIDE 0.9 % IV BOLUS
500.0000 mL | Freq: Once | INTRAVENOUS | Status: AC
  Administered 2021-10-23: 500 mL via INTRAVENOUS

## 2021-10-23 NOTE — Discharge Instructions (Signed)
Labs show that the patient is dehydrated, no signs of urinary tract infection or pneumonia.  COVID and flu test are negative as well.  CTs of the head and neck show no acute injury from fall.  Make sure she is increasing her oral fluid intake and follow-up with your primary care doctor for recheck of her lab work.

## 2021-10-23 NOTE — ED Notes (Signed)
Attempted in and out cath x 2, unsuccessful. EDP aware

## 2021-10-23 NOTE — ED Provider Notes (Signed)
[facility]-EMERGENCY DEPT Provider Note   CSN: [id number] Arrival date & time: 10/23/21  2032     History No chief complaint on file.   Kimberly Byrd is a 87 y.o. female.  Kimberly Byrd is a 87 y.o. female with a history of hypertension, and Alzheimer's, who presents to the ED with husband from home for evaluation of falls, generalized weakness and some urinary frequency.  Patient's husband reports that this evening after their in-home caretaker left he tried to get the patient out of her recliner which she usually able to do with her assistance but she could not seem to help and get her up out of the chair and then she slid onto the ground, this was witnessed at this time she did not hit her head.  Then she seemed to be able to stand up with some assistance and was walking with shuffling gait towards the bedroom when she had another fall this time he thinks she hit her head on the wall but did not have any loss of consciousness.  He was unable to get her up and so called EMS.  He reports that she seemed weaker over the past few days has had some urinary frequency but has not complained of dysuria.  No known history of frequent UTIs.  She reports she has had an occasional cough but has not complained of chest pain or shortness of breath.  No known fevers.  Has not complained of abdominal pain.  He reports that she had seen with her Alzheimer's at baseline but thinks this may have been a bit worse over the past few days.  She has had decreased p.o. intake as well.  Level 5 caveat: Dementia  The history is provided by the patient, the spouse and medical records.      Past Medical History:  Diagnosis Date   Cataract    Hypertension     Patient Active Problem List   Diagnosis Date Noted   Late onset Alzheimer's disease with behavioral disturbance (HCC) 06/29/2021    Past Surgical History:  Procedure Laterality Date   EYE SURGERY     VAGINAL HYSTERECTOMY  1991      OB History   No obstetric history on file.     Family History  Problem Relation Age of Onset   Breast cancer Mother    Heart disease Brother     Social History   Tobacco Use   Smoking status: Never   Smokeless tobacco: Never  Vaping Use   Vaping Use: Never used  Substance Use Topics   Alcohol use: No   Drug use: No    Home Medications Prior to Admission medications   Medication Sig Start Date End Date Taking? Authorizing Provider  alendronate (FOSAMAX) 70 MG tablet Take 70 mg by mouth every 7 (seven) days. Take with a full glass of water on an empty stomach.    [provider]  amLODipine (NORVASC) 5 MG tablet Take 5 mg by mouth daily. 06/19/15   [provider]  atenolol (TENORMIN) 50 MG tablet Take 50 mg by mouth daily.     [provider]  Cholecalciferol 2000 UNITS CAPS Take 2,000 Units by mouth daily.    [provider]  escitalopram (LEXAPRO) 5 MG tablet Take 1 tablet (5 mg total) by mouth daily. Patient taking differently: Take 10 mg by mouth daily. 07/26/20   Van Clines, MD  ezetimibe (ZETIA) 10 MG tablet Take 10 mg  by mouth at bedtime.     [provider]  triamcinolone cream (KENALOG) 0.1 % Apply topically 2 (two) times daily as needed. 02/27/21   [provider]    Allergies    Levofloxacin, Penicillins, Sulfa antibiotics, Denosumab, Epinephrine, and Lipitor [atorvastatin calcium]  Review of Systems   Review of Systems  Unable to perform ROS: Dementia   Physical Exam Updated Vital Signs BP (!) 150/67 (BP Location: Left Arm)    Pulse 62    Temp 98.1 F (36.7 C) (Oral)    Resp 17    SpO2 100%   Physical Exam Vitals and nursing note reviewed.  Constitutional:      General: She is not in acute distress.    Appearance: Normal appearance. She is well-developed. She is not diaphoretic.     Comments: Alert and oriented to baseline, chronically ill-appearing but in no acute distress  HENT:     Head:  Normocephalic.     Comments: Small hematoma palpable on the posterior scalp without step-off or deformity, negative battle sign    Mouth/Throat:     Mouth: Mucous membranes are dry.     Pharynx: Oropharynx is clear.  Eyes:     General:        Right eye: No discharge.        Left eye: No discharge.     Extraocular Movements: Extraocular movements intact.     Pupils: Pupils are equal, round, and reactive to light.  Neck:     Comments: No midline C-spine tenderness Cardiovascular:     Rate and Rhythm: Normal rate and regular rhythm.     Pulses: Normal pulses.     Heart sounds: Normal heart sounds.  Pulmonary:     Effort: Pulmonary effort is normal. No respiratory distress.     Breath sounds: Normal breath sounds. No wheezing or rales.     Comments: Respirations equal and unlabored, patient able to speak in full sentences, lungs clear to auscultation bilaterally  Chest:     Chest wall: No tenderness.  Abdominal:     General: Bowel sounds are normal. There is no distension.     Palpations: Abdomen is soft. There is no mass.     Tenderness: There is no abdominal tenderness. There is no guarding.     Comments: Abdomen soft, nondistended, nontender to palpation in all quadrants without guarding or peritoneal signs  Musculoskeletal:        General: No deformity.     Cervical back: Neck supple.     Comments: No midline spinal tenderness Moving all extremities, all compartments soft, joint supple and easily movable  Skin:    General: Skin is warm and dry.     Capillary Refill: Capillary refill takes less than 2 seconds.  Neurological:     Mental Status: She is alert and oriented to person, place, and time.     Coordination: Coordination normal.     Comments: Speech is clear, able to follow commands CN III-XII intact Normal strength in upper and lower extremities bilaterally including dorsiflexion and plantar flexion, strong and equal grip strength Sensation normal to light and sharp  touch Moves extremities without ataxia, coordination intact  Psychiatric:        Mood and Affect: Mood normal.        Behavior: Behavior normal.    ED Results / Procedures / Treatments   Labs (all labs ordered are listed, but only abnormal results are displayed) Labs Reviewed  COMPREHENSIVE METABOLIC  PANEL - Abnormal; Notable for the following components:      Result Value   Sodium 128 (*)    Chloride 94 (*)    Glucose, Bld 131 (*)    BUN 27 (*)    Total Protein 8.2 (*)    All other components within normal limits  CBC WITH DIFFERENTIAL/PLATELET - Abnormal; Notable for the following components:   WBC 12.8 (*)    Neutro Abs 9.1 (*)    Monocytes Absolute 1.4 (*)    All other components within normal limits  URINALYSIS, ROUTINE W REFLEX MICROSCOPIC - Abnormal; Notable for the following components:   Color, Urine STRAW (*)    Hgb urine dipstick MODERATE (*)    Ketones, ur 5 (*)    Protein, ur 30 (*)    All other components within normal limits  RESP PANEL BY RT-PCR (FLU A&B, COVID) ARPGX2  URINE CULTURE    EKG None  Radiology DG Chest 2 View  Result Date: 10/23/2021 CLINICAL DATA:  Weakness, confusion EXAM: CHEST - 2 VIEW COMPARISON:  10/19/2021 FINDINGS: Heart is normal size. Aortic atherosclerosis. Lungs clear. No effusions or acute bony abnormality. IMPRESSION: No active cardiopulmonary disease. Electronically Signed   By: Charlett Nose M.D.   On: 10/23/2021 22:42   CT Head Wo Contrast  Result Date: 10/23/2021 CLINICAL DATA:  Confusion, fall, dementia EXAM: CT HEAD WITHOUT CONTRAST CT CERVICAL SPINE WITHOUT CONTRAST TECHNIQUE: Multidetector CT imaging of the head and cervical spine was performed following the standard protocol without intravenous contrast. Multiplanar CT image reconstructions of the cervical spine were also generated. COMPARISON:  10/19/2021 FINDINGS: CT HEAD FINDINGS Brain: No evidence of acute infarction, hemorrhage, hydrocephalus, extra-axial collection  or mass lesion/mass effect. Mild subcortical white matter and periventricular small vessel ischemic changes. Vascular: Intracranial atherosclerosis. Skull: Normal. Negative for fracture or focal lesion. Sinuses/Orbits: The visualized paranasal sinuses are essentially clear. The mastoid air cells are unopacified. Other: None. CT CERVICAL SPINE FINDINGS Alignment: Normal cervical lordosis. Skull base and vertebrae: No acute fracture. No primary bone lesion or focal pathologic process. Soft tissues and spinal canal: No prevertebral fluid or swelling. No visible canal hematoma. Disc levels: Mild degenerative changes of the lower cervical spine. Spinal canal is patent. Upper chest: Visualized lung apices are clear. Other: Visualized thyroid is unremarkable. IMPRESSION: No evidence of acute intracranial abnormality. Mild small vessel ischemic changes. No evidence of traumatic injury to the cervical spine. Mild degenerative changes of the lower cervical spine. Electronically Signed   By: Charline Bills M.D.   On: 10/23/2021 22:51   CT Cervical Spine Wo Contrast  Result Date: 10/23/2021 CLINICAL DATA:  Confusion, fall, dementia EXAM: CT HEAD WITHOUT CONTRAST CT CERVICAL SPINE WITHOUT CONTRAST TECHNIQUE: Multidetector CT imaging of the head and cervical spine was performed following the standard protocol without intravenous contrast. Multiplanar CT image reconstructions of the cervical spine were also generated. COMPARISON:  10/19/2021 FINDINGS: CT HEAD FINDINGS Brain: No evidence of acute infarction, hemorrhage, hydrocephalus, extra-axial collection or mass lesion/mass effect. Mild subcortical white matter and periventricular small vessel ischemic changes. Vascular: Intracranial atherosclerosis. Skull: Normal. Negative for fracture or focal lesion. Sinuses/Orbits: The visualized paranasal sinuses are essentially clear. The mastoid air cells are unopacified. Other: None. CT CERVICAL SPINE FINDINGS Alignment: Normal  cervical lordosis. Skull base and vertebrae: No acute fracture. No primary bone lesion or focal pathologic process. Soft tissues and spinal canal: No prevertebral fluid or swelling. No visible canal hematoma. Disc levels: Mild degenerative changes of the lower  cervical spine. Spinal canal is patent. Upper chest: Visualized lung apices are clear. Other: Visualized thyroid is unremarkable. IMPRESSION: No evidence of acute intracranial abnormality. Mild small vessel ischemic changes. No evidence of traumatic injury to the cervical spine. Mild degenerative changes of the lower cervical spine. Electronically Signed   By: Charline Bills M.D.   On: 10/23/2021 22:51    Procedures Procedures   Medications Ordered in ED Medications  sodium chloride 0.9 % bolus 500 mL (500 mLs Intravenous New Bag/Given 10/23/21 2305)  sodium chloride 0.9 % bolus 500 mL (500 mLs Intravenous New Bag/Given 10/23/21 2337)    ED Course  I have reviewed the triage vital signs and the nursing notes.  Pertinent labs & imaging results that were available during my care of the patient were reviewed by me and considered in my medical decision making (see chart for details).    MDM Rules/Calculators/A&P                           87 year old female presents from home for evaluation of falls, generalized weakness, urinary frequency and increased confusion.  She is also had decreased p.o. intake over the past few days.  Had 2 falls and thinks she may have had head injury without LOC with second fall.  Has not complained of any pain since the fall.  EMS had to be called to help get patient out of the floor.  Underlying dementia but husband feels that she has been a bit more confused and weaker than usual.  No focal weakness or deficits on neurologic exam.  Has a small hematoma to the back of the head but no other signs of head trauma.  Has not vomited or complained of chest or abdominal pain.  Husband reports an occasional cough.  We  will check lab work, chest x-ray, urinalysis, COVID and flu test as well as CTs of the head and cervical spine given fall.  We will also give IV fluids.  I have independently ordered, reviewed and interpreted all labs and imaging: CBC: Slight leukocytosis of 12.8, normal hemoglobin CMP: Mild hyponatremia of 128 with hypochloremia and elevated BUN of 27, I suspect this is in the setting of dehydration, no other significant electrolyte derangements, normal creatinine and normal liver function UA: No signs of infection COVID/flu: Negative  Chest x-ray: No active cardiopulmonary disease CT of the head and cervical spine without acute traumatic injury or other acute abnormality.  Work-up today suggest dehydration which could certainly contribute to some increased confusion and weakness.  Patient given 1 L of IV fluids.  Will be discharged home for continued care with her home caregiver and husband encouraged increase p.o. intake.  PCP follow-up discussed and return precautions provided.  Discharged home in good condition.  Final Clinical Impression(s) / ED Diagnoses Final diagnoses:  Dehydration  Generalized weakness  Fall, initial encounter    Rx / DC Orders ED Discharge Orders     None        Dartha Lodge, [place] 10/24/21 0049    Derwood Kaplan, MD 10/24/21 0110

## 2021-10-23 NOTE — ED Triage Notes (Signed)
Pt is from home with her husband, he reports that she's more confused with urinary frequency and decreased appetite Pt has dementia Pt fell again tonight, she also fell on the 9th, the bruise on her head is from the fall on the 9th No current complaints

## 2021-10-24 LAB — URINE CULTURE: Culture: NO GROWTH

## 2021-10-24 MED ORDER — TUBERCULIN PPD 5 UNIT/0.1ML ID SOLN
5.0000 [IU] | INTRADERMAL | Status: DC
  Filled 2021-10-24: qty 0.1

## 2021-10-24 MED ORDER — HALOPERIDOL LACTATE 5 MG/ML IJ SOLN
5.0000 mg | Freq: Once | INTRAMUSCULAR | Status: AC
  Administered 2021-10-24: 02:00:00 5 mg via INTRAMUSCULAR
  Filled 2021-10-24: qty 1

## 2021-10-24 NOTE — [place] FL2 (Signed)
°  [place] MEDICAID FL2 LEVEL OF CARE SCREENING TOOL     IDENTIFICATION  Patient Name: Kimberly Byrd Birthdate: 28-Feb-1931 Sex: female Admission Date (Current Location): 10/23/2021  [facility] and [place] Number:  Producer, television/film/video and Address:  [facility],  [address] [place], [place] [address]      Provider Number: [id number]  Attending Physician Name and Address:  Default, Provider, MD  Relative Name and Phone Number:  Kili, Gracy (Spouse)   [phone]    Current Level of Care: Hospital Recommended Level of Care: Assisted Living Facility Prior Approval Number:    Date Approved/Denied:   PASRR Number:    Discharge Plan: Other (Comment) (ALF)    Current Diagnoses: Patient Active Problem List   Diagnosis Date Noted   Late onset Alzheimer's disease with behavioral disturbance (HCC) 06/29/2021    Orientation RESPIRATION BLADDER Height & Weight     Self  Normal Incontinent Weight:   Height:     BEHAVIORAL SYMPTOMS/MOOD NEUROLOGICAL BOWEL NUTRITION STATUS      Incontinent Diet  AMBULATORY STATUS COMMUNICATION OF NEEDS Skin   Limited Assist Verbally Normal                       Personal Care Assistance Level of Assistance  Bathing, Feeding, Dressing Bathing Assistance: Limited assistance Feeding assistance: Independent Dressing Assistance: Limited assistance     Functional Limitations Info  Sight, Hearing, Speech Sight Info: Adequate Hearing Info: Adequate Speech Info: Adequate    SPECIAL CARE FACTORS FREQUENCY                       Contractures Contractures Info: Not present    Additional Factors Info  Code Status, Allergies Code Status Info: full Allergies Info: Levofloxacin,Penicillins,Sulfa Antibiotics,Denosumab,Epinephrine,Lipitor (atorvastatin Calcium           Current Medications (10/24/2021):  This is the current hospital active medication list Current Facility-Administered Medications  Medication Dose Route  Frequency Provider Last Rate Last Admin   tuberculin injection 5 Units  5 Units Intradermal STAT Margarita Grizzle, MD       Current Outpatient Medications  Medication Sig Dispense Refill   alendronate (FOSAMAX) 70 MG tablet Take 70 mg by mouth every 7 (seven) days. Take with a full glass of water on an empty stomach.     amLODipine (NORVASC) 5 MG tablet Take 5 mg by mouth daily.     atenolol (TENORMIN) 50 MG tablet Take 50 mg by mouth daily.      Cholecalciferol 2000 UNITS CAPS Take 2,000 Units by mouth daily.     escitalopram (LEXAPRO) 5 MG tablet Take 1 tablet (5 mg total) by mouth daily. (Patient taking differently: Take 10 mg by mouth daily.) 30 tablet 11   ezetimibe (ZETIA) 10 MG tablet Take 10 mg by mouth at bedtime.      triamcinolone cream (KENALOG) 0.1 % Apply topically 2 (two) times daily as needed.       Discharge Medications: Please see discharge summary for a list of discharge medications.  Relevant Imaging Results:  Relevant Lab Results:   Additional Information SSN:[id number]  COVID x 3  Lakhia Gengler M Sheryl Saintil, LCSW

## 2021-10-24 NOTE — ED Notes (Signed)
Pt oriented to person alone. Resting quietly in ED hall stretcher. Pt's husband at the bedside. Social Work paged regarding status of pt placement.

## 2021-10-24 NOTE — Progress Notes (Signed)
.  Transition of Care [facility]) - Emergency Department Mini Assessment   Patient Details  Name: Kimberly Byrd MRN: [id number] Date of Birth: [birth year]  Transition [facility]) CM/SW Contact:    Larrie Kass, LCSW Phone Number: 10/24/2021, 12:36 PM   Clinical Narrative:  TOC CSW spoke with pt's spouse Sherril Shipman, and son Recce They reported pt has become increasingly weak over the past couple of weeks. Pt lives with her spouse and he is unable to care for pt due to decline. Pt's has had frequent falls, the last time being yesterday. PT has seen pt and recommended short-term rehab. Pt spouse and son are requesting pt transferred to [facility].   CSW spoke with [phone]), who reported having a bed available. The facility is requesting TB skin and FL2 to email out to [email or web address]. CSW made MD and RN aware of TB test. CSW to complete FL2.        ED Mini Assessment: What brought you to the Emergency Department? : fall  Barriers to Discharge: Continued Medical Work up, SNF Pending bed offer        Interventions which prevented an admission or readmission: SNF Placement    Patient Contact and Communications        ,                 Admission diagnosis:  Generalized Weakness Patient Active Problem List   Diagnosis Date Noted   Late onset Alzheimer's disease with behavioral disturbance (HCC) 06/29/2021   PCP:  Tally Joe, MD Pharmacy:   [facility] [place], [place] - [address] [facility] [address] [place] [address] Phone: [phone] Fax: [phone]

## 2021-10-24 NOTE — Evaluation (Signed)
Physical Therapy Evaluation Patient Details Name: Kimberly Byrd MRN: [id number] DOB: [birth year] Today's Date: 10/24/2021  History of Present Illness  Kimberly Byrd is a 87 y.o. female with a history of hypertension, and Alzheimer's, who presents to the ED 10/23/21  with husband from home for evaluation of falls, generalized weakness and some urinary frequency. L4 superior endplate compression fracxture  noted 10/19/21  Clinical Impression  Patient resting on gurney, son at bedside to provide information, patient unable. Per son, until recently, patient able to ambulate in home without then with RW, patient was  continent   of B/B.  Patient  sustained a fall and has been declining in mobility. Patient requires hand over hand /multimodal cues   to roll and sit up. Did stand on STEDY platform with mod assistance but had difficulty guiding patient to return to sit and then return to supine. Required  total assistance of 2 to manage getting  patient back  to supine. .   Per son,  Dc plan is for a facility for  the patient as the spouse is not able to provide the physical assistnace since patient has declined in mobility.  Patient may benefit from short rehab to improve mobility and decrease burden of care if patient able  to participate, hoping that she will.  Pt admitted with above diagnosis.  Pt currently with functional limitations due to the deficits listed below (see PT Problem List). Pt will benefit from skilled PT to increase their independence and safety with mobility to allow discharge to the venue listed below.        Recommendations for follow up therapy are one component of a multi-disciplinary discharge planning process, led by the attending physician.  Recommendations may be updated based on patient status, additional functional criteria and insurance authorization.  Follow Up Recommendations Skilled nursing-short term rehab (<3 hours/day)    Assistance Recommended at Discharge  Frequent or constant Supervision/Assistance  Functional Status Assessment Patient has had a recent decline in their functional status and demonstrates the ability to make significant improvements in function in a reasonable and predictable amount of time.  Equipment Recommendations  None recommended by PT    Recommendations for Other Services       Precautions / Restrictions Precautions Precautions: Fall Precaution Comments: incontitnent, needs  pads      Mobility  Bed Mobility Overal bed mobility: (P) Needs Assistance Bed Mobility: Rolling;Sidelying to Sit;Sit to Sidelying Rolling: Max assist Sidelying to sit: Max assist;HOB elevated Supine to sit: (P) Total assist;+2 for safety/equipment;+2 for physical assistance Sit to supine: (P) Total assist;+2 for physical assistance;+2 for safety/equipment Sit to sidelying: Total assist;+2 for physical assistance;+2 for safety/equipment General bed mobility comments: with slow steady progression, able to assist patient to roll to left side. Therapist placed legs over bed edge and slowly raised trunk to sitting position. patient did scoot self a little to straighten hips on bed edge. When ready to return to  supine, patient would not let go of the STEDY. Required  ASSIST TO REMOVE HANDS AND +2 TOTAL TO RETURN TO  SIDE AND SUPINE. Patient incontinent of urine when standing.    Transfers Overall transfer level: Needs assistance   Transfers: Sit to/from Stand Sit to Stand: +2 safety/equipment;+2 physical assistance;From elevated surface;Mod assist           General transfer comment: Patient's feet not touching floor so used STEDy to stand on platform. patient able to stand with UE support, unable to follow to  take a small step forward and back. Patient required extensive manual cues to sit back down onto gurney. Patient  not able to follow directions to return to sitting.    Ambulation/Gait                  Stairs             Wheelchair Mobility    Modified Rankin (Stroke Patients Only)       Balance Overall balance assessment: Needs assistance;History of Falls Sitting-balance support: Feet supported;Bilateral upper extremity supported Sitting balance-Leahy Scale: Poor Sitting balance - Comments: reliant on close  supervision/support   Standing balance support: During functional activity;Bilateral upper extremity supported;Reliant on assistive device for balance Standing balance-Leahy Scale: Poor                               Pertinent Vitals/Pain Pain Assessment: Faces Faces Pain Scale: Hurts little more Pain Location: pointed to lower abdomen Pain Intervention(s): Limited activity within patient's tolerance;Monitored during session    Home Living Family/patient expects to be discharged to:: Skilled nursing facility Living Arrangements: Spouse/significant other;Children Available Help at Discharge: Available 24 hours/day Type of Home: House Home Access: Stairs to enter   [employer] of Steps: 2   Home Layout: One level Home Equipment: Agricultural consultant (2 wheels);Cane - single point      Prior Function Prior Level of Function : History of Falls (last six months)             Mobility Comments: until 12/9, patient ambulated in home with supervision. ADLs Comments: assisted by souse     Hand Dominance        Extremity/Trunk Assessment   Upper Extremity Assessment Upper Extremity Assessment: Overall WFL for tasks assessed    Lower Extremity Assessment Lower Extremity Assessment: Generalized weakness    Cervical / Trunk Assessment Cervical / Trunk Assessment: Kyphotic  Communication   Communication: No difficulties  Cognition Arousal/Alertness: Awake/alert Behavior During Therapy: Anxious;Restless Overall Cognitive Status: Impaired/Different from baseline Area of Impairment: Orientation                 Orientation Level:  Person;Place;Time;Situation             General Comments: son present, reports decline recentlly in patient ability to be dredirected and participate in self care, does not know son, states has no children. does not follow dirrections, required multimodal cues to get hands free from grip of rail and from STEDY` Son reports that patient recognizeds spouse but not son.        General Comments      Exercises     Assessment/Plan    PT Assessment Patient needs continued PT services  PT Problem List Decreased strength;Decreased mobility;Decreased safety awareness;Decreased knowledge of precautions;Decreased activity tolerance;Decreased cognition;Decreased balance       PT Treatment Interventions DME instruction;Therapeutic activities;Cognitive remediation;Gait training;Therapeutic exercise;Patient/family education;Functional mobility training    PT Goals (Current goals can be found in the Care Plan section)  Acute Rehab PT Goals Patient Stated Goal: per son, to go into a facility, "My dad can not provide care she needs." PT Goal Formulation: With family Time For Goal Achievement: 11/07/21 Potential to Achieve Goals: Fair    Frequency Min 2X/week   Barriers to discharge Decreased caregiver support      Co-evaluation               AM-PAC PT "6 Clicks"  Mobility  Outcome Measure Help needed turning from your back to your side while in a flat bed without using bedrails?: A Lot Help needed moving from lying on your back to sitting on the side of a flat bed without using bedrails?: Total Help needed moving to and from a bed to a chair (including a wheelchair)?: Total Help needed standing up from a chair using your arms (e.g., wheelchair or bedside chair)?: Total Help needed to walk in hospital room?: Total Help needed climbing 3-5 steps with a railing? : Total 6 Click Score: 7    End of Session Equipment Utilized During Treatment: Gait belt Activity Tolerance: Patient  tolerated treatment well Patient left: in bed;with family/visitor present Nurse Communication: Mobility status (need for pericare/linens changed.) PT Visit Diagnosis: Difficulty in walking, not elsewhere classified (R26.2);Repeated falls (R29.6)    Time: 4734-0370 PT Time Calculation (min) (ACUTE ONLY): 26 min   Charges:   PT Evaluation $PT Eval Low Complexity: 1 Low PT Treatments $Therapeutic Activity: 8-22 mins        Blanchard Kelch PT Acute Rehabilitation Services Pager [phone] Office [phone]   Rada Hay 10/24/2021, 10:12 AM

## 2021-10-24 NOTE — ED Notes (Signed)
Quantiferon Gold blood work ordered by EDP at this time.

## 2021-10-24 NOTE — ED Notes (Signed)
Pt assisted to family vehicle per family request to transport themselves as opposed to [place], [facility] called to cancel transport request

## 2021-10-24 NOTE — ED Notes (Signed)
PT at the bedside.

## 2021-10-24 NOTE — Progress Notes (Addendum)
CSW emailed FL2 to [place] at [facility].  She confirmed she had received it and reports they can accept pt back tonight.   [facility] transport called for transport, there are 17 people in front of this pt for transportation. Daleen Squibb, MSW, LCSW 12/14/20226:35 PM    Family has decided to transport pt themselves.  [facility] cancelled. Daleen Squibb, MSW, LCSW 12/14/20228:43 PM

## 2021-10-24 NOTE — ED Notes (Signed)
This nurse notified EDP, Dr. Rosalia Hammers, regarding order for TB test. No new orders at this time.

## 2021-10-24 NOTE — ED Notes (Signed)
Attempted blood draw, unsuccessful. RN will attempt.

## 2021-10-24 NOTE — ED Notes (Signed)
PPD skin test ordered by EDP, Dr. Rosalia Hammers, however, per Social Work this is the wrong test for pt placement at facility. There is no one available on staff at ALF facility to read the PPD test in 48hrs, per Social Work.

## 2021-10-24 NOTE — ED Notes (Signed)
Pt was provided perineal care, bed bath, new brief applied, linens changed and repositioned to comfort by NT/EMT and EMT-P.

## 2021-10-24 NOTE — ED Notes (Signed)
Social Work to consult pt.

## 2021-10-24 NOTE — ED Notes (Signed)
Dr. Lynelle Doctor, oncoming EDP, notified and aware to order Quantiferon Gold blood work.

## 2021-10-24 NOTE — ED Notes (Signed)
This nurse and EDP, Dr. Rosalia Hammers notified by Donato Schultz, LCSW, pt will require TB test to be placed at ALF.

## 2021-10-24 NOTE — ED Notes (Signed)
Pt's husband stating that he doesn't feel safe taking the pt home tonight due to her being a high fall risk and he would have to be up all night with her. Pt's husband stating that he would like for the pt to possibly go to a nursing home

## 2021-10-29 ENCOUNTER — Ambulatory Visit: Payer: Medicare PPO | Admitting: Podiatry

## 2021-10-29 DIAGNOSIS — M25531 Pain in right wrist: Secondary | ICD-10-CM | POA: Diagnosis not present

## 2021-10-30 DIAGNOSIS — R300 Dysuria: Secondary | ICD-10-CM | POA: Diagnosis not present

## 2021-10-30 LAB — QUANTIFERON-TB GOLD PLUS (RQFGPL)
QuantiFERON Mitogen Value: 2.81 IU/mL
QuantiFERON Nil Value: 0 IU/mL
QuantiFERON TB1 Ag Value: 0.01 IU/mL
QuantiFERON TB2 Ag Value: 0 IU/mL

## 2021-10-30 LAB — QUANTIFERON-TB GOLD PLUS: QuantiFERON-TB Gold Plus: NEGATIVE

## 2021-10-31 DIAGNOSIS — G301 Alzheimer's disease with late onset: Secondary | ICD-10-CM | POA: Diagnosis not present

## 2021-10-31 DIAGNOSIS — M810 Age-related osteoporosis without current pathological fracture: Secondary | ICD-10-CM | POA: Diagnosis not present

## 2021-10-31 DIAGNOSIS — F419 Anxiety disorder, unspecified: Secondary | ICD-10-CM | POA: Diagnosis not present

## 2021-10-31 DIAGNOSIS — E7800 Pure hypercholesterolemia, unspecified: Secondary | ICD-10-CM | POA: Diagnosis not present

## 2021-10-31 DIAGNOSIS — I10 Essential (primary) hypertension: Secondary | ICD-10-CM | POA: Diagnosis not present

## 2021-11-05 DIAGNOSIS — R9431 Abnormal electrocardiogram [ECG] [EKG]: Secondary | ICD-10-CM | POA: Diagnosis not present

## 2021-11-05 DIAGNOSIS — Z66 Do not resuscitate: Secondary | ICD-10-CM | POA: Diagnosis not present

## 2021-11-05 DIAGNOSIS — J189 Pneumonia, unspecified organism: Secondary | ICD-10-CM | POA: Diagnosis not present

## 2021-11-05 DIAGNOSIS — R404 Transient alteration of awareness: Secondary | ICD-10-CM | POA: Diagnosis not present

## 2021-11-05 DIAGNOSIS — N179 Acute kidney failure, unspecified: Secondary | ICD-10-CM | POA: Diagnosis not present

## 2021-11-05 DIAGNOSIS — G309 Alzheimer's disease, unspecified: Secondary | ICD-10-CM | POA: Diagnosis not present

## 2021-11-05 DIAGNOSIS — J159 Unspecified bacterial pneumonia: Secondary | ICD-10-CM | POA: Diagnosis not present

## 2021-11-05 DIAGNOSIS — K922 Gastrointestinal hemorrhage, unspecified: Secondary | ICD-10-CM | POA: Diagnosis not present

## 2021-11-05 DIAGNOSIS — K5730 Diverticulosis of large intestine without perforation or abscess without bleeding: Secondary | ICD-10-CM | POA: Diagnosis not present

## 2021-11-05 DIAGNOSIS — J9601 Acute respiratory failure with hypoxia: Secondary | ICD-10-CM | POA: Diagnosis not present

## 2021-11-05 DIAGNOSIS — D62 Acute posthemorrhagic anemia: Secondary | ICD-10-CM | POA: Diagnosis not present

## 2021-11-05 DIAGNOSIS — E870 Hyperosmolality and hypernatremia: Secondary | ICD-10-CM | POA: Diagnosis not present

## 2021-11-05 DIAGNOSIS — R52 Pain, unspecified: Secondary | ICD-10-CM | POA: Diagnosis not present

## 2021-11-05 DIAGNOSIS — J168 Pneumonia due to other specified infectious organisms: Secondary | ICD-10-CM | POA: Diagnosis not present

## 2021-11-05 DIAGNOSIS — F0282 Dementia in other diseases classified elsewhere, unspecified severity, with psychotic disturbance: Secondary | ICD-10-CM | POA: Diagnosis not present

## 2021-11-05 DIAGNOSIS — F0390 Unspecified dementia without behavioral disturbance: Secondary | ICD-10-CM | POA: Diagnosis not present

## 2021-11-05 DIAGNOSIS — K625 Hemorrhage of anus and rectum: Secondary | ICD-10-CM | POA: Diagnosis not present

## 2021-11-05 DIAGNOSIS — R296 Repeated falls: Secondary | ICD-10-CM | POA: Diagnosis not present

## 2021-11-05 DIAGNOSIS — F32A Depression, unspecified: Secondary | ICD-10-CM | POA: Diagnosis not present

## 2021-11-05 DIAGNOSIS — R0902 Hypoxemia: Secondary | ICD-10-CM | POA: Diagnosis not present

## 2021-11-05 DIAGNOSIS — I080 Rheumatic disorders of both mitral and aortic valves: Secondary | ICD-10-CM | POA: Diagnosis not present

## 2021-11-05 DIAGNOSIS — N281 Cyst of kidney, acquired: Secondary | ICD-10-CM | POA: Diagnosis not present

## 2021-11-05 DIAGNOSIS — N3289 Other specified disorders of bladder: Secondary | ICD-10-CM | POA: Diagnosis not present

## 2021-11-05 DIAGNOSIS — E785 Hyperlipidemia, unspecified: Secondary | ICD-10-CM | POA: Diagnosis not present

## 2021-11-05 DIAGNOSIS — R7989 Other specified abnormal findings of blood chemistry: Secondary | ICD-10-CM | POA: Diagnosis not present

## 2021-11-05 DIAGNOSIS — J101 Influenza due to other identified influenza virus with other respiratory manifestations: Secondary | ICD-10-CM | POA: Diagnosis not present

## 2021-11-05 DIAGNOSIS — K6289 Other specified diseases of anus and rectum: Secondary | ICD-10-CM | POA: Diagnosis not present

## 2021-11-05 DIAGNOSIS — K6389 Other specified diseases of intestine: Secondary | ICD-10-CM | POA: Diagnosis not present

## 2021-11-05 DIAGNOSIS — N1330 Unspecified hydronephrosis: Secondary | ICD-10-CM | POA: Diagnosis not present

## 2021-11-05 DIAGNOSIS — W19XXXA Unspecified fall, initial encounter: Secondary | ICD-10-CM | POA: Diagnosis not present

## 2021-11-05 DIAGNOSIS — G8911 Acute pain due to trauma: Secondary | ICD-10-CM | POA: Diagnosis not present

## 2021-11-05 DIAGNOSIS — N320 Bladder-neck obstruction: Secondary | ICD-10-CM | POA: Diagnosis not present

## 2021-11-05 DIAGNOSIS — J1008 Influenza due to other identified influenza virus with other specified pneumonia: Secondary | ICD-10-CM | POA: Diagnosis not present

## 2021-11-05 DIAGNOSIS — S32048A Other fracture of fourth lumbar vertebra, initial encounter for closed fracture: Secondary | ICD-10-CM | POA: Diagnosis not present

## 2021-11-05 DIAGNOSIS — Z515 Encounter for palliative care: Secondary | ICD-10-CM | POA: Diagnosis not present

## 2021-11-05 DIAGNOSIS — R531 Weakness: Secondary | ICD-10-CM | POA: Diagnosis not present

## 2021-11-05 DIAGNOSIS — Z20822 Contact with and (suspected) exposure to covid-19: Secondary | ICD-10-CM | POA: Diagnosis not present

## 2021-11-05 DIAGNOSIS — E441 Mild protein-calorie malnutrition: Secondary | ICD-10-CM | POA: Diagnosis not present

## 2021-11-05 DIAGNOSIS — R918 Other nonspecific abnormal finding of lung field: Secondary | ICD-10-CM | POA: Diagnosis not present

## 2021-12-12 DEATH — deceased

## 2021-12-13 ENCOUNTER — Telehealth: Payer: Self-pay | Admitting: Neurology

## 2021-12-13 NOTE — Telephone Encounter (Signed)
Pt's husband called in to let us know the patient passed away on 15-Dec-2021. He wanted to thank Dr. Karel Jarvis for everything she had done for his wife.

## 2022-01-01 ENCOUNTER — Ambulatory Visit: Payer: Medicare PPO | Admitting: Physician Assistant

## 2022-01-08 ENCOUNTER — Ambulatory Visit: Payer: Medicare PPO | Admitting: Physician Assistant

## 2022-01-09 DEATH — deceased

## 2023-08-30 IMAGING — CT CT CERVICAL SPINE W/O CM
3 of 4 series · 13 of 33 positions shown, 16 images · non-contrast
Comparison: 10/19/2021

CLINICAL DATA: Confusion, fall, dementia

EXAM:
CT HEAD WITHOUT CONTRAST
CT CERVICAL SPINE WITHOUT CONTRAST
TECHNIQUE: Multidetector CT imaging of the head and cervical spine was
performed following the standard protocol without intravenous
contrast. Multiplanar CT image reconstructions of the cervical spine
were also generated.

[Series 4: orthogonal bone · axial · 0.28mm/px · z∈[-275,-155]mm · 5 of 95 slices shown, 7 images]
[im 14/95  soft-tissue]
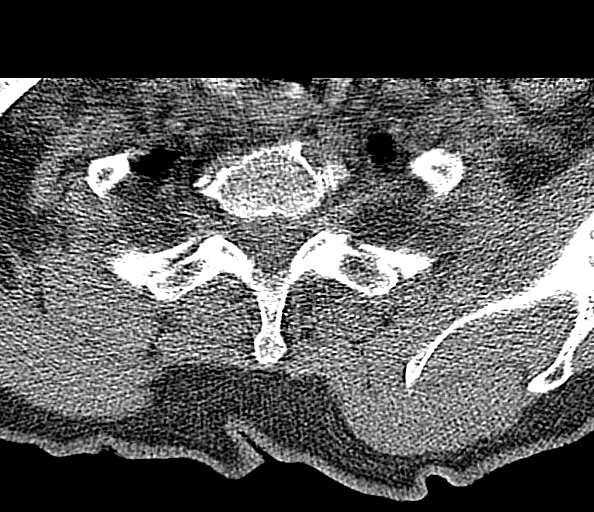
[im 14/95  bone]
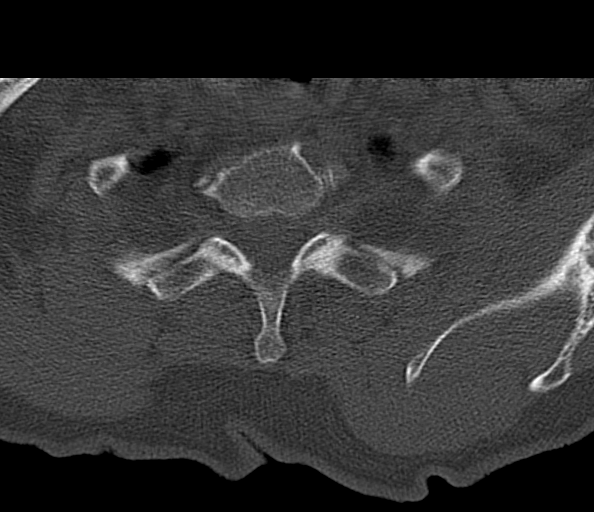
[im 27/95  bone]
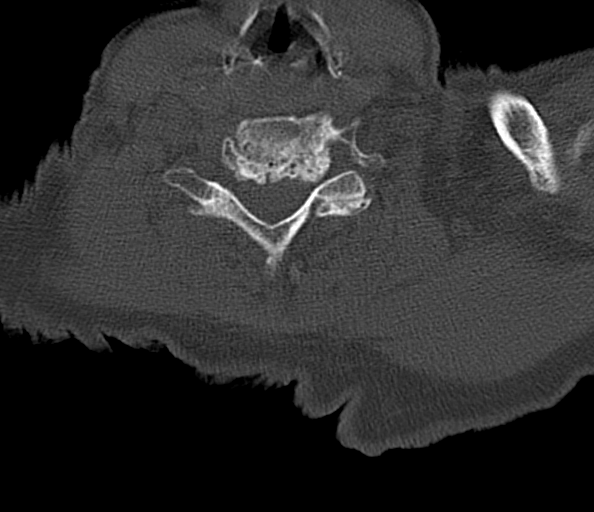
[im 54/95  bone]
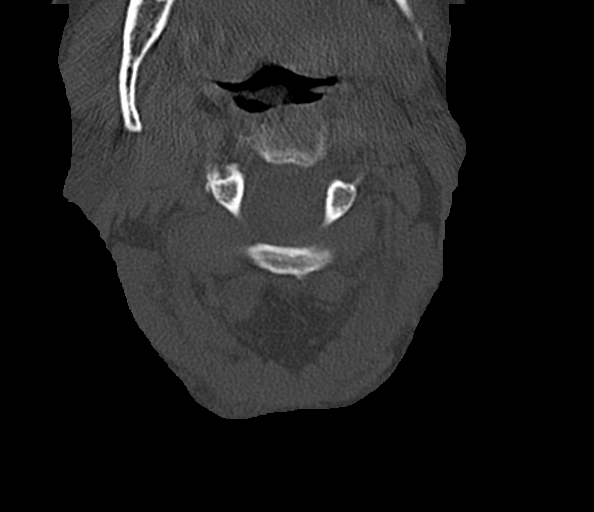
[im 68/95  bone]
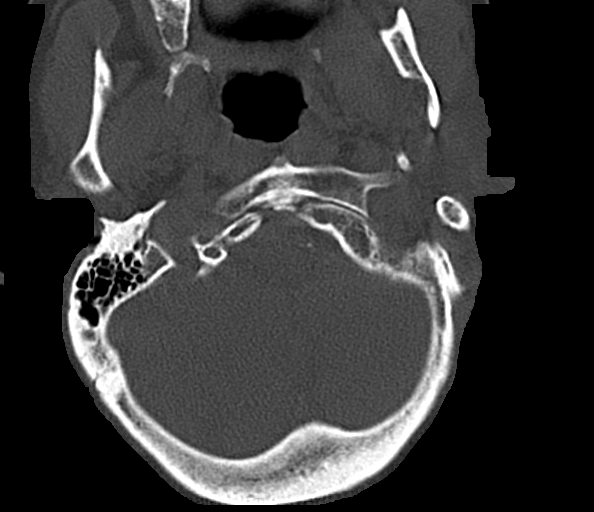
[im 81/95  soft-tissue]
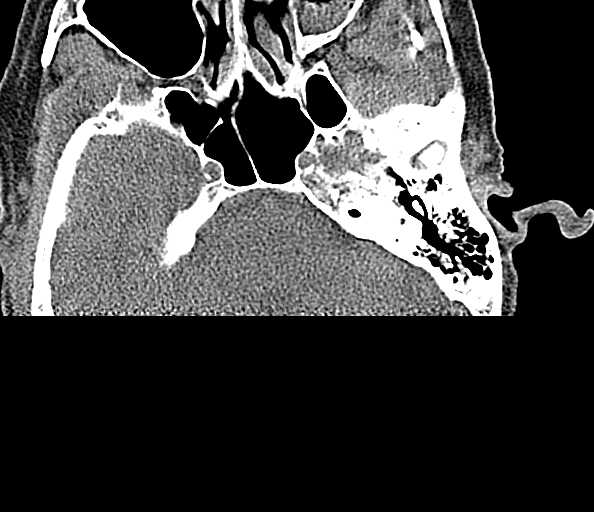
[im 81/95  bone]
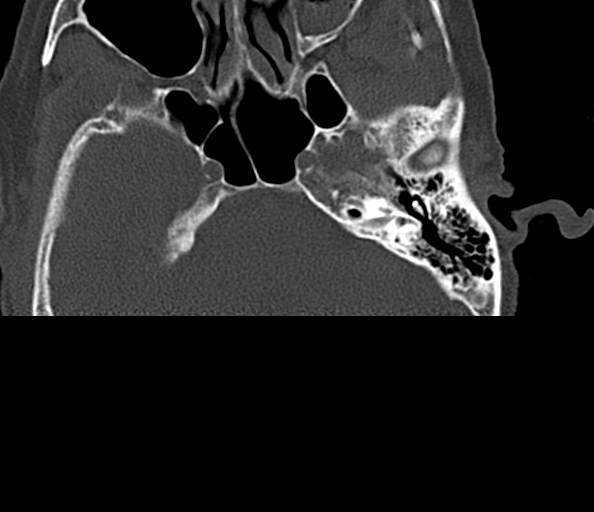

[Series 5: coronal bone · coronal · 0.32mm/px · 3 of 72 slices shown]
[im 20/72  bone]
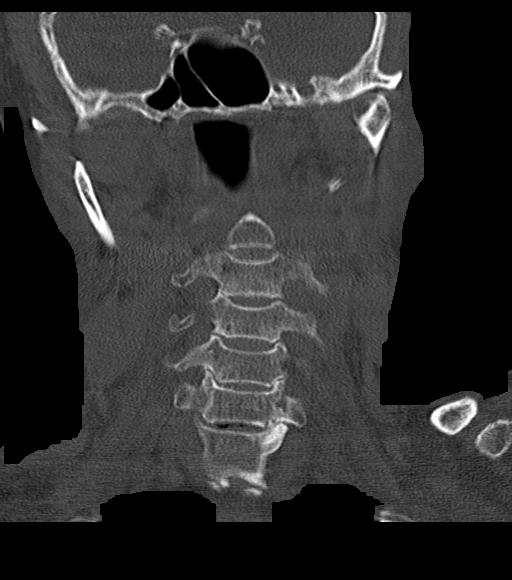
[im 31/72  bone]
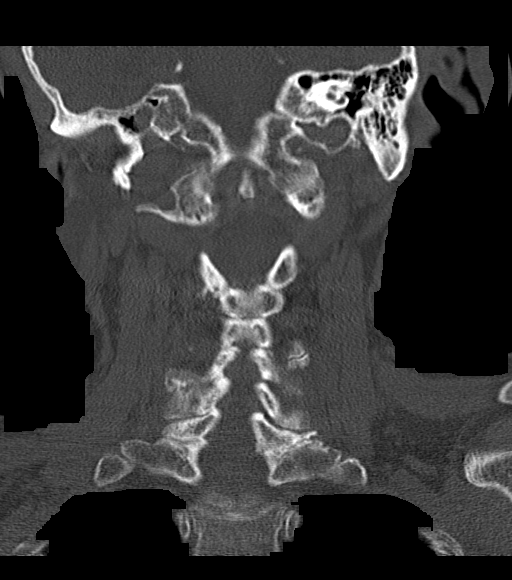
[im 42/72  bone]
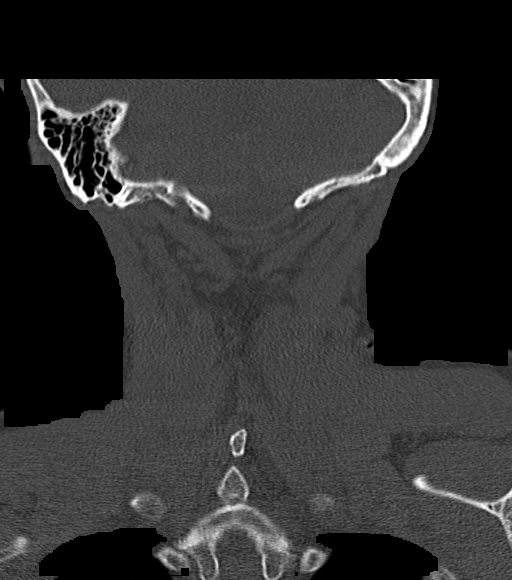

[Series 6: sagittal bone · sagittal · 0.28mm/px · 5 of 84 slices shown, 6 images]
[im 28/84  bone]
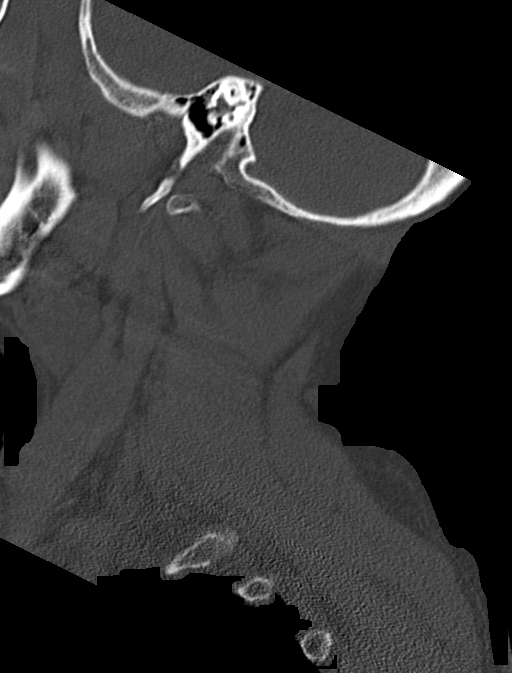
[im 35/84  bone]
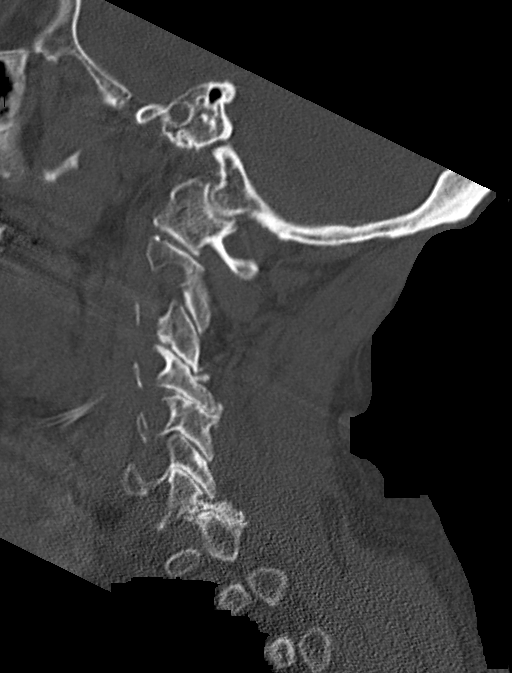
[im 42/84  soft-tissue]
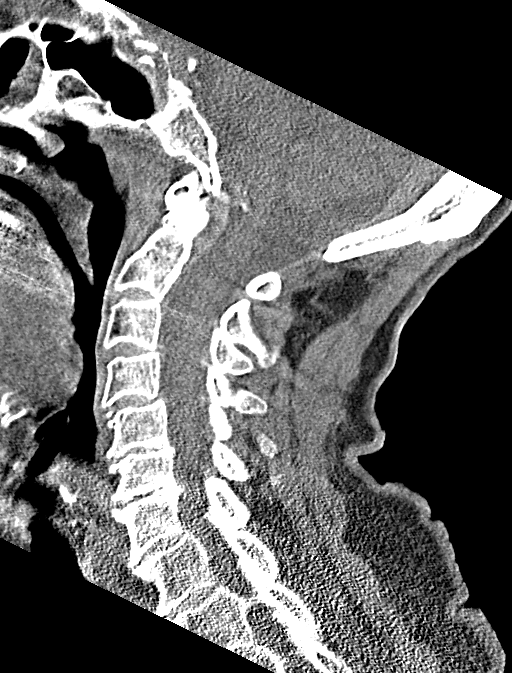
[im 42/84  bone]
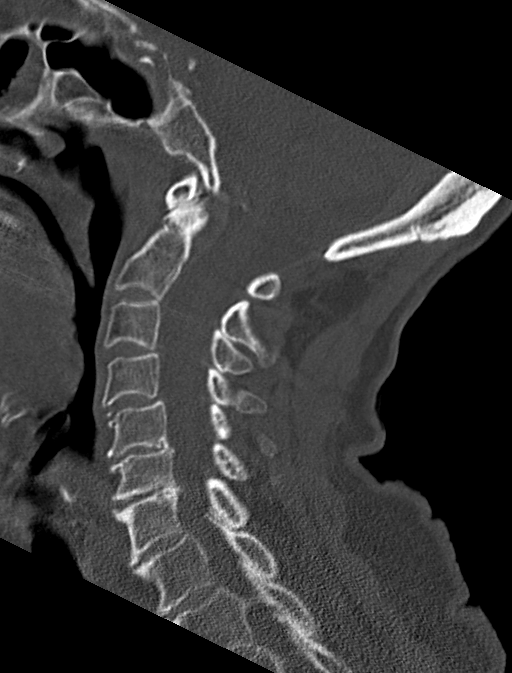
[im 49/84  bone]
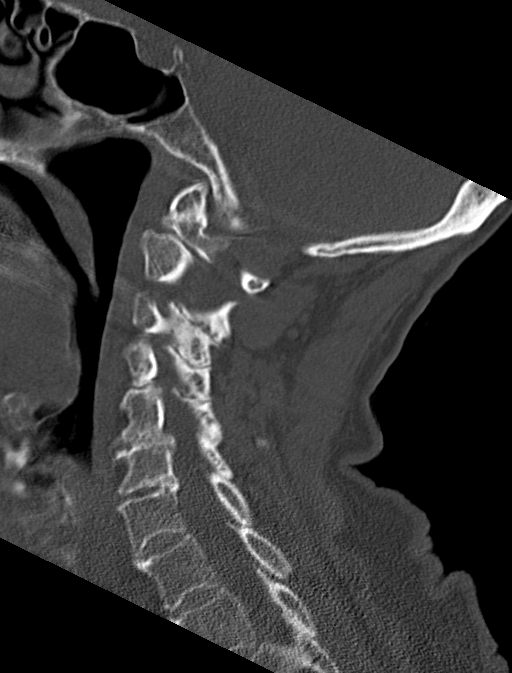
[im 56/84  bone]
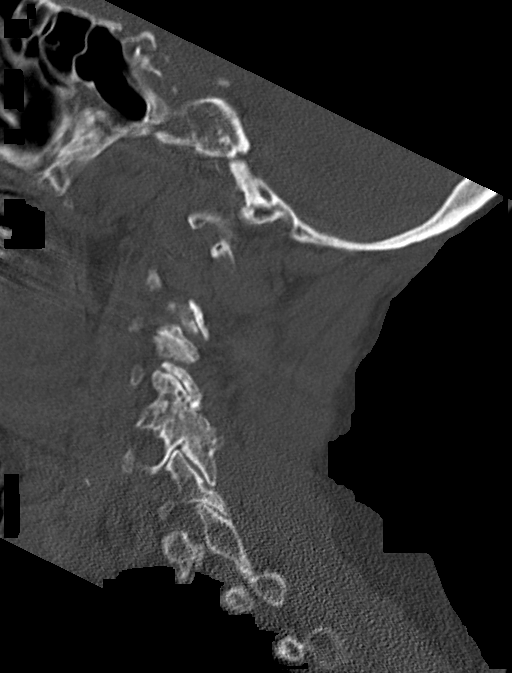

[13 of 33 positions shown; findings below may reference images not displayed]

FINDINGS: CT HEAD FINDINGS

Brain: No evidence of acute infarction, hemorrhage, hydrocephalus,
extra-axial collection or mass lesion/mass effect.

Mild subcortical white matter and periventricular small vessel
ischemic changes.

Vascular: Intracranial atherosclerosis.

Skull: Normal. Negative for fracture or focal lesion.

Sinuses/Orbits: The visualized paranasal sinuses are essentially
clear. The mastoid air cells are unopacified.

Other: None.

CT CERVICAL SPINE FINDINGS

Alignment: Normal cervical lordosis.

Skull base and vertebrae: No acute fracture. No primary bone lesion
or focal pathologic process.

Soft tissues and spinal canal: No prevertebral fluid or swelling. No
visible canal hematoma.

Disc levels: Mild degenerative changes of the lower cervical spine.
Spinal canal is patent.

Upper chest: Visualized lung apices are clear.

Other: Visualized thyroid is unremarkable.
IMPRESSION: No evidence of acute intracranial abnormality. Mild small vessel
ischemic changes.

No evidence of traumatic injury to the cervical spine. Mild
degenerative changes of the lower cervical spine.

## 2023-08-30 IMAGING — CR DG CHEST 2V
2 series · 2 of 2 positions shown · non-contrast
Comparison: 10/19/2021

CLINICAL DATA: Weakness, confusion

EXAM:
CHEST - 2 VIEW

[w chest lat]
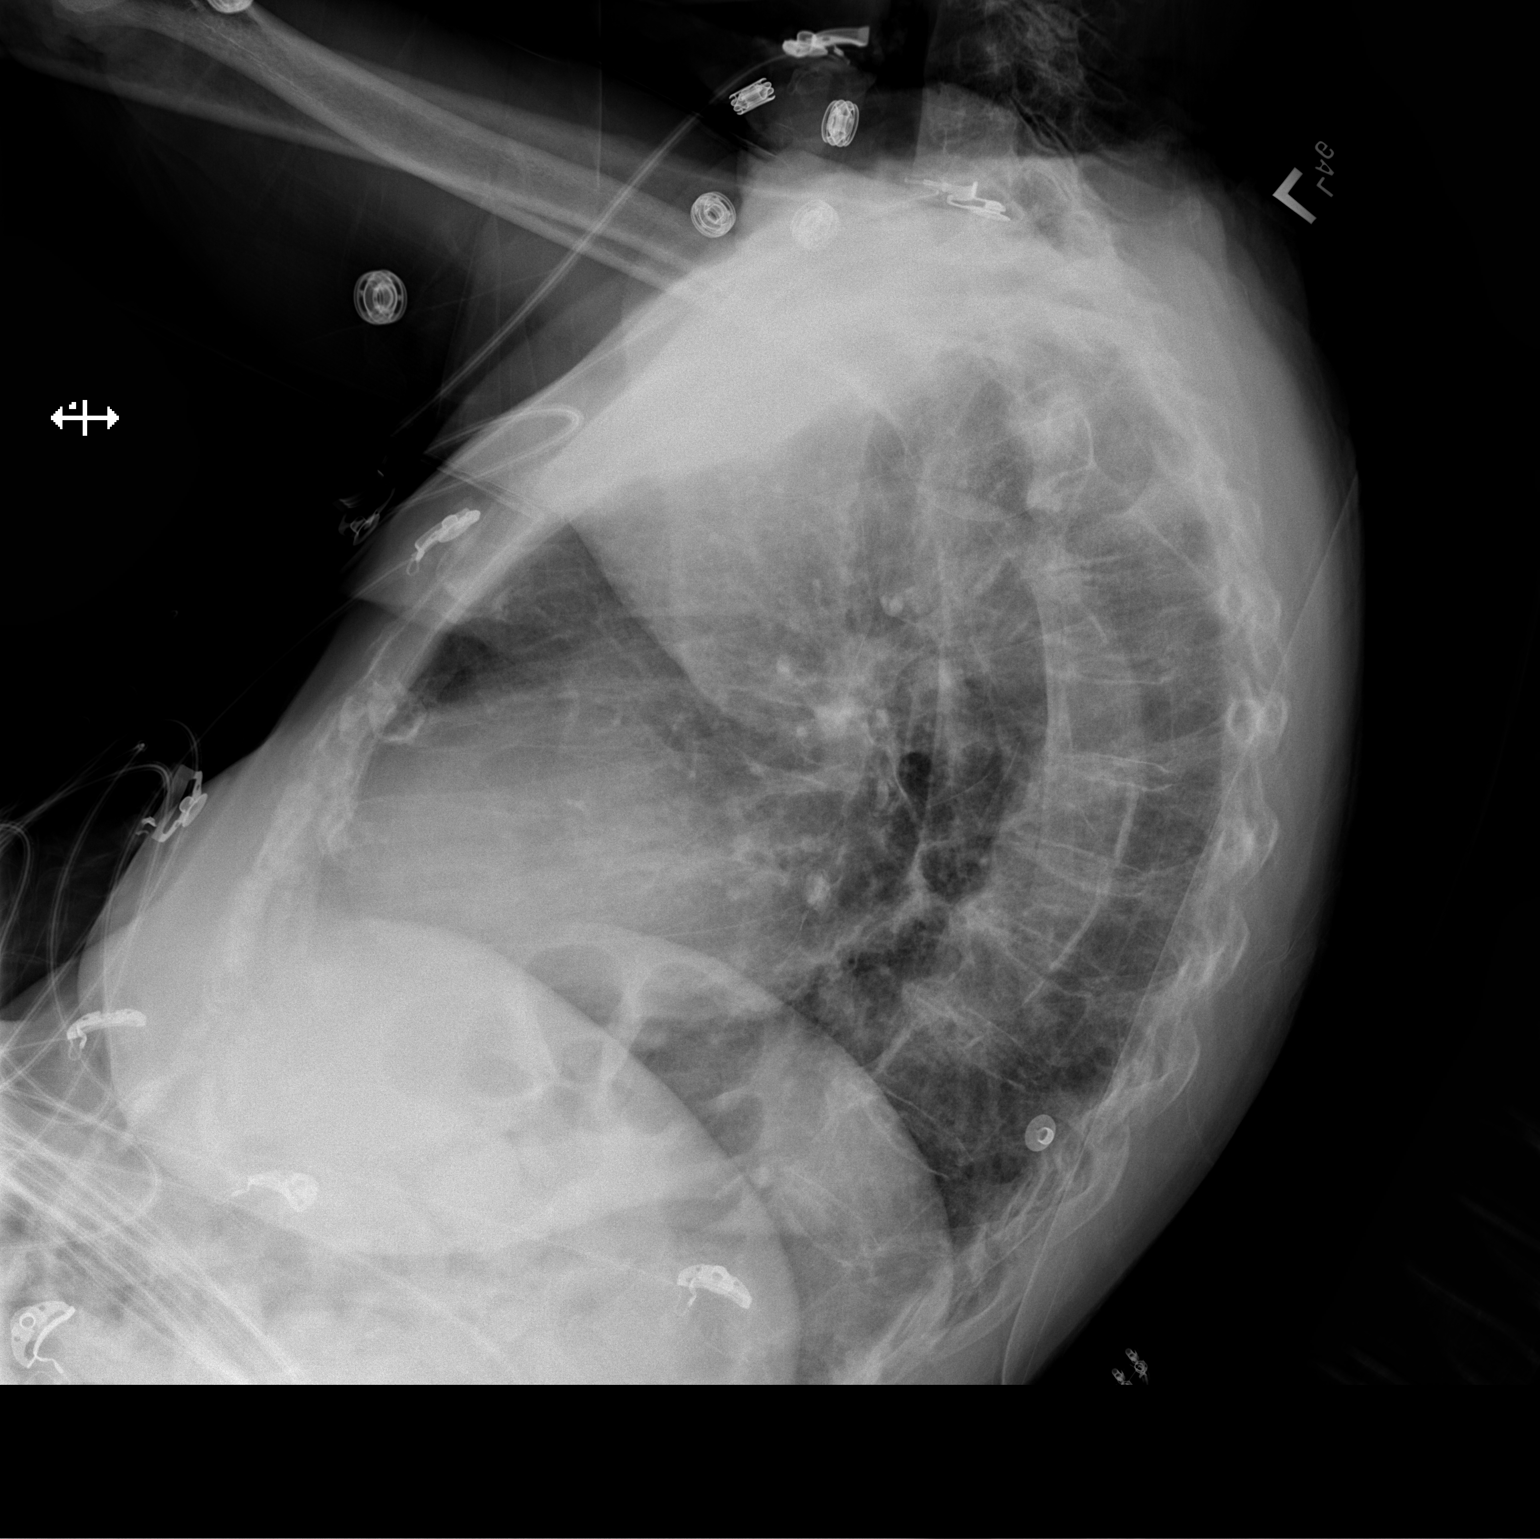

[x chest ap]
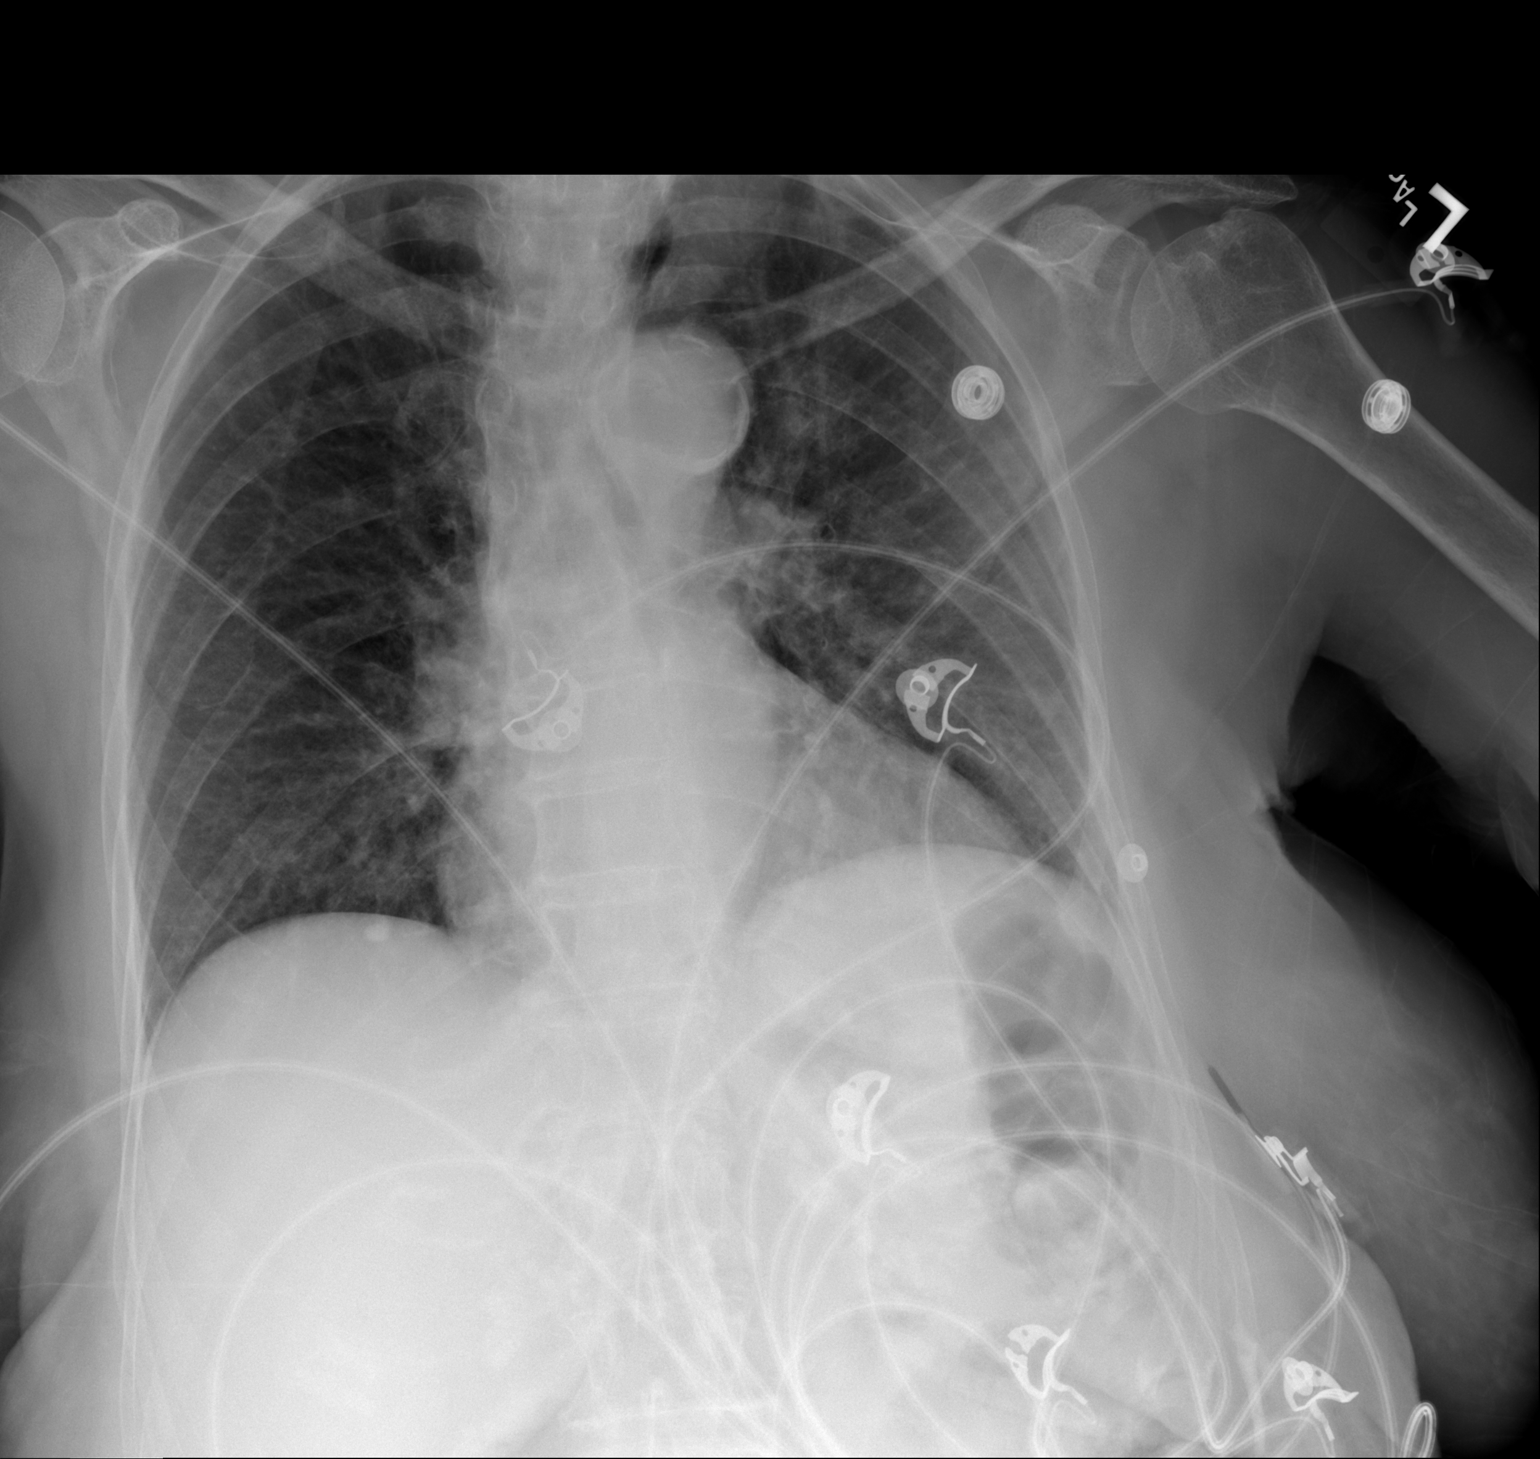

[2 of 2 positions shown; findings below may reference images not displayed]

FINDINGS: Heart is normal size. Aortic atherosclerosis. Lungs clear. No
effusions or acute bony abnormality.
IMPRESSION: No active cardiopulmonary disease.
